# Patient Record
Sex: Female | Born: 1970 | Race: White | Hispanic: No | Smoking: Current some day smoker
Health system: Southern US, Community
[De-identification: ages and names within clinical notes are randomized; demographics above are authoritative.]

## PROBLEM LIST (undated history)

## (undated) DIAGNOSIS — J45909 Unspecified asthma, uncomplicated: Secondary | ICD-10-CM

## (undated) DIAGNOSIS — F32A Depression, unspecified: Secondary | ICD-10-CM

## (undated) DIAGNOSIS — G43909 Migraine, unspecified, not intractable, without status migrainosus: Secondary | ICD-10-CM

## (undated) DIAGNOSIS — F329 Major depressive disorder, single episode, unspecified: Secondary | ICD-10-CM

## (undated) DIAGNOSIS — J302 Other seasonal allergic rhinitis: Secondary | ICD-10-CM

## (undated) HISTORY — PX: NASAL SINUS SURGERY: SHX719

## (undated) HISTORY — PX: ABDOMINAL HYSTERECTOMY: SHX81

## (undated) HISTORY — PX: CHOLECYSTECTOMY: SHX55

---

## 2010-09-11 ENCOUNTER — Other Ambulatory Visit (HOSPITAL_COMMUNITY): Payer: Self-pay | Admitting: Internal Medicine

## 2010-09-11 DIAGNOSIS — Z1231 Encounter for screening mammogram for malignant neoplasm of breast: Secondary | ICD-10-CM

## 2010-09-21 ENCOUNTER — Ambulatory Visit (HOSPITAL_COMMUNITY)
Admission: RE | Admit: 2010-09-21 | Discharge: 2010-09-21 | Disposition: A | Discharge status: Home / Self Care | Payer: Self-pay | Source: Ambulatory Visit | Attending: Internal Medicine | Admitting: Internal Medicine

## 2010-09-21 DIAGNOSIS — Z1231 Encounter for screening mammogram for malignant neoplasm of breast: Secondary | ICD-10-CM

## 2011-03-10 ENCOUNTER — Other Ambulatory Visit (HOSPITAL_COMMUNITY): Payer: Self-pay | Admitting: Internal Medicine

## 2011-03-10 ENCOUNTER — Ambulatory Visit (HOSPITAL_COMMUNITY)
Admission: RE | Admit: 2011-03-10 | Discharge: 2011-03-10 | Disposition: A | Discharge status: Home / Self Care | Payer: Self-pay | Source: Ambulatory Visit | Attending: Internal Medicine | Admitting: Internal Medicine

## 2011-03-10 DIAGNOSIS — R52 Pain, unspecified: Secondary | ICD-10-CM

## 2011-03-10 DIAGNOSIS — Z01818 Encounter for other preprocedural examination: Secondary | ICD-10-CM | POA: Insufficient documentation

## 2012-09-04 ENCOUNTER — Encounter: Payer: Self-pay | Admitting: Obstetrics & Gynecology

## 2012-09-09 ENCOUNTER — Emergency Department (HOSPITAL_BASED_OUTPATIENT_CLINIC_OR_DEPARTMENT_OTHER)
Admission: EM | Admit: 2012-09-09 | Discharge: 2012-09-09 | Disposition: A | Discharge status: Home / Self Care | Payer: Self-pay | Attending: Emergency Medicine | Admitting: Emergency Medicine

## 2012-09-09 ENCOUNTER — Encounter (HOSPITAL_BASED_OUTPATIENT_CLINIC_OR_DEPARTMENT_OTHER): Payer: Self-pay | Admitting: *Deleted

## 2012-09-09 ENCOUNTER — Emergency Department (HOSPITAL_BASED_OUTPATIENT_CLINIC_OR_DEPARTMENT_OTHER): Payer: Self-pay

## 2012-09-09 DIAGNOSIS — J45901 Unspecified asthma with (acute) exacerbation: Secondary | ICD-10-CM | POA: Insufficient documentation

## 2012-09-09 DIAGNOSIS — R05 Cough: Secondary | ICD-10-CM | POA: Insufficient documentation

## 2012-09-09 DIAGNOSIS — R059 Cough, unspecified: Secondary | ICD-10-CM | POA: Insufficient documentation

## 2012-09-09 DIAGNOSIS — Z8709 Personal history of other diseases of the respiratory system: Secondary | ICD-10-CM | POA: Insufficient documentation

## 2012-09-09 HISTORY — DX: Unspecified asthma, uncomplicated: J45.909

## 2012-09-09 HISTORY — DX: Other seasonal allergic rhinitis: J30.2

## 2012-09-09 HISTORY — DX: Major depressive disorder, single episode, unspecified: F32.9

## 2012-09-09 HISTORY — DX: Depression, unspecified: F32.A

## 2012-09-09 MED ORDER — ALBUTEROL SULFATE (5 MG/ML) 0.5% IN NEBU: 5.0000 mg | INHALATION_SOLUTION | RESPIRATORY_TRACT | Status: AC | PRN

## 2012-09-09 MED ORDER — PREDNISONE 10 MG PO TABS
60.0000 mg | ORAL_TABLET | Freq: Once | ORAL | Status: AC
  Administered 2012-09-09: 60 mg via ORAL
  Filled 2012-09-09: qty 1

## 2012-09-09 MED ORDER — ALBUTEROL SULFATE (5 MG/ML) 0.5% IN NEBU
5.0000 mg | INHALATION_SOLUTION | Freq: Once | RESPIRATORY_TRACT | Status: AC
  Administered 2012-09-09: 5 mg via RESPIRATORY_TRACT
  Filled 2012-09-09: qty 1

## 2012-09-09 MED ORDER — ALBUTEROL SULFATE (5 MG/ML) 0.5% IN NEBU
5.0000 mg | INHALATION_SOLUTION | Freq: Once | RESPIRATORY_TRACT | Status: AC
  Administered 2012-09-09: 5 mg via RESPIRATORY_TRACT

## 2012-09-09 MED ORDER — ALBUTEROL SULFATE (5 MG/ML) 0.5% IN NEBU
INHALATION_SOLUTION | RESPIRATORY_TRACT | Status: AC
  Filled 2012-09-09: qty 1

## 2012-09-09 MED ORDER — IPRATROPIUM BROMIDE 0.02 % IN SOLN
RESPIRATORY_TRACT | Status: AC
  Filled 2012-09-09: qty 2.5

## 2012-09-09 MED ORDER — IPRATROPIUM BROMIDE 0.02 % IN SOLN
0.5000 mg | Freq: Once | RESPIRATORY_TRACT | Status: AC
  Administered 2012-09-09: 0.5 mg via RESPIRATORY_TRACT

## 2012-09-09 MED ORDER — PREDNISONE 20 MG PO TABS: 60.0000 mg | ORAL_TABLET | Freq: Every day | ORAL | Status: AC

## 2012-09-09 NOTE — ED Notes (Signed)
Patient has been experiencing sob for the past week, which grew worse yesterday, [facility], used inhaler this morning, but still experiencing sob, took antibiotic earlier this week, but did not complete

## 2012-09-09 NOTE — ED Provider Notes (Signed)
History     CSN: [id number]  Arrival date & time 09/09/12  4098   First MD Initiated Contact with Patient 09/09/12 1002      Chief Complaint  Patient presents with  . Shortness of Breath    (Consider location/radiation/quality/duration/timing/severity/associated sxs/prior treatment) Patient is a 42 y.o. female presenting with shortness of breath.  Shortness of Breath  Pt with history of asthma/bronchitis reports she had had cough and difficulty breathing for the last several weeks. She was seen at PCP office and give medications for allergies without improvement. She went to a [facility] earlier this week and given albuterol HFA but continued to have cough, wheezing and SOB. She reports [facility] would not give her a refill on her nebulizer medications or any steroids.   No past medical history on file.  No past surgical history on file.  No family history on file.  History  Substance Use Topics  . Smoking status: Not on file  . Smokeless tobacco: Not on file  . Alcohol Use: Not on file    OB History   No data available      Review of Systems  Respiratory: Positive for shortness of breath.    All other systems reviewed and are negative except as noted in HPI.   Allergies  Review of patient's allergies indicates not on file.  Home Medications  No current outpatient prescriptions on file.  BP 150/91  Pulse 107  Temp(Src) 98.1 F (36.7 C) (Oral)  Resp 26  SpO2 95%  Physical Exam  Nursing note and vitals reviewed. Constitutional: She is oriented to person, place, and time. She appears well-developed and well-nourished.  HENT:  Head: Normocephalic and atraumatic.  Eyes: EOM are normal. Pupils are equal, round, and reactive to light.  Neck: Normal range of motion. Neck supple.  Cardiovascular: Normal rate, normal heart sounds and intact distal pulses.   Pulmonary/Chest: She has wheezes. She has no rales.  Increased work of breathing  Abdominal: Bowel  sounds are normal. She exhibits no distension. There is no tenderness.  Musculoskeletal: Normal range of motion. She exhibits no edema and no tenderness.  Neurological: She is alert and oriented to person, place, and time. She has normal strength. No cranial nerve deficit or sensory deficit.  Skin: Skin is warm and dry. No rash noted.  Psychiatric: She has a normal mood and affect.    ED Course  Procedures (including critical care time)  Labs Reviewed - No data to display Dg Chest 2 View  09/09/2012  *RADIOLOGY REPORT*  Clinical Data: Cough, wheezing  CHEST - 2 VIEW  Comparison: 03/10/11  Findings: Cardiomediastinal silhouette is unremarkable.  No acute infiltrate or pleural effusion.  No pulmonary edema.  Bony thorax is unremarkable.  IMPRESSION: No active disease.   Original Report Authenticated By: Natasha Mead, M.D.      1. Asthma exacerbation       MDM  CXR, nebs, prednisone and reassess.   11:17 AM Wheezing improved after second neb. CXR clear. Plan for discharge with short burst of steroids, refill for nebs and PCP followup.       Dae Antonucci B. Bernette Mayers, MD 09/09/12 1118

## 2015-01-07 ENCOUNTER — Encounter (HOSPITAL_COMMUNITY): Payer: Self-pay | Admitting: *Deleted

## 2015-01-07 ENCOUNTER — Emergency Department (HOSPITAL_COMMUNITY)
Admission: EM | Admit: 2015-01-07 | Discharge: 2015-01-07 | Disposition: A | Discharge status: Home / Self Care | Payer: Managed Care, Other (non HMO) | Attending: Emergency Medicine | Admitting: Emergency Medicine

## 2015-01-07 DIAGNOSIS — J029 Acute pharyngitis, unspecified: Secondary | ICD-10-CM | POA: Insufficient documentation

## 2015-01-07 DIAGNOSIS — Z79899 Other long term (current) drug therapy: Secondary | ICD-10-CM | POA: Diagnosis not present

## 2015-01-07 DIAGNOSIS — Z7952 Long term (current) use of systemic steroids: Secondary | ICD-10-CM | POA: Diagnosis not present

## 2015-01-07 DIAGNOSIS — R0981 Nasal congestion: Secondary | ICD-10-CM | POA: Diagnosis not present

## 2015-01-07 DIAGNOSIS — R509 Fever, unspecified: Secondary | ICD-10-CM | POA: Insufficient documentation

## 2015-01-07 DIAGNOSIS — F329 Major depressive disorder, single episode, unspecified: Secondary | ICD-10-CM | POA: Diagnosis not present

## 2015-01-07 DIAGNOSIS — J45909 Unspecified asthma, uncomplicated: Secondary | ICD-10-CM | POA: Diagnosis not present

## 2015-01-07 DIAGNOSIS — Z720 Tobacco use: Secondary | ICD-10-CM | POA: Diagnosis not present

## 2015-01-07 HISTORY — DX: Migraine, unspecified, not intractable, without status migrainosus: G43.909

## 2015-01-07 LAB — RAPID STREP SCREEN (MED CTR MEBANE ONLY): STREPTOCOCCUS, GROUP A SCREEN (DIRECT): NEGATIVE

## 2015-01-07 NOTE — ED Notes (Signed)
Pt reports a 2 week hx of sore throat ,HA and low grade fever

## 2015-01-07 NOTE — ED Provider Notes (Signed)
CSN: [id number]     Arrival date & time 01/07/15  0819 History   First MD Initiated Contact with Patient 01/07/15 [phone]     Chief Complaint  Patient presents with  . Sore Throat  . Fever     (Consider location/radiation/quality/duration/timing/severity/associated sxs/prior Treatment) Patient is a 44 y.o. female presenting with pharyngitis and fever. The history is provided by the patient. No language interpreter was used.  Sore Throat This is a new problem. The current episode started in the past 7 days. The problem occurs constantly. The problem has been unchanged. Associated symptoms include congestion, coughing and a fever. Pertinent negatives include no rash or vomiting. The symptoms are aggravated by swallowing. She has tried nothing for the symptoms.  Fever Associated symptoms: congestion and cough   Associated symptoms: no rash and no vomiting     Past Medical History  Diagnosis Date  . Asthma   . Seasonal allergies   . Depression    Past Surgical History  Procedure Laterality Date  . Abdominal hysterectomy    . Cesarean section      X 2   No family history on file. Social History  Substance Use Topics  . Smoking status: Current Some Day Smoker  . Smokeless tobacco: Not on file  . Alcohol Use: Yes   OB History    No data available     Review of Systems  Constitutional: Positive for fever.  HENT: Positive for congestion.   Respiratory: Positive for cough.   Gastrointestinal: Negative for vomiting.  Skin: Negative for rash.  All other systems reviewed and are negative.     Allergies  Review of patient's allergies indicates no known allergies.  Home Medications   Prior to Admission medications   Medication Sig Start Date End Date Taking? Authorizing Provider  albuterol (PROVENTIL) (5 MG/ML) 0.5% nebulizer solution Take 1 mL (5 mg total) by nebulization every 4 (four) hours as needed for wheezing. 09/09/12   Susy Frizzle, MD  ALBUTEROL IN Inhale into  the lungs as needed.    Historical Provider, MD  ClonazePAM (KLONOPIN PO) Take by mouth as needed.    Historical Provider, MD  Montelukast Sodium (SINGULAIR PO) Take by mouth.    Historical Provider, MD  predniSONE (DELTASONE) 20 MG tablet Take 3 tablets (60 mg total) by mouth daily. 09/09/12   Susy Frizzle, MD   There were no vitals taken for this visit. Physical Exam  Constitutional: She is oriented to person, place, and time. She appears well-developed and well-nourished.  HENT:  Right Ear: External ear normal.  Left Ear: External ear normal.  Mouth/Throat: Posterior oropharyngeal erythema present.  Cardiovascular: Normal rate and regular rhythm.   Pulmonary/Chest: Effort normal and breath sounds normal.  Musculoskeletal: Normal range of motion.  Neurological: She is alert and oriented to person, place, and time. Coordination normal.  Skin: Skin is warm and dry.  Nursing note and vitals reviewed.   ED Course  Procedures (including critical care time) Labs Review Labs Reviewed  RAPID STREP SCREEN (NOT AT [facility])    Imaging Review No results found. I have personally reviewed and evaluated these images and lab results as part of my medical decision-making.   EKG Interpretation None      MDM   Final diagnoses:  Pharyngitis    Strep is negative. Likely viral in nature    Teressa Lower, NP 01/07/15 1191  Lyndal Pulley, MD 01/07/15 [phone]

## 2015-01-07 NOTE — ED Notes (Signed)
Declined W/C at D/C and was escorted to lobby by RN. 

## 2015-01-07 NOTE — Discharge Instructions (Signed)

## 2015-01-09 LAB — CULTURE, GROUP A STREP: STREP A CULTURE: NEGATIVE

## 2018-10-29 ENCOUNTER — Other Ambulatory Visit: Payer: Self-pay

## 2018-10-29 ENCOUNTER — Emergency Department (HOSPITAL_COMMUNITY): Payer: Self-pay

## 2018-10-29 ENCOUNTER — Encounter (HOSPITAL_COMMUNITY): Payer: Self-pay | Admitting: Emergency Medicine

## 2018-10-29 ENCOUNTER — Emergency Department (HOSPITAL_COMMUNITY)
Admission: EM | Admit: 2018-10-29 | Discharge: 2018-10-29 | Disposition: A | Discharge status: Home / Self Care | Payer: Self-pay | Attending: Emergency Medicine | Admitting: Emergency Medicine

## 2018-10-29 DIAGNOSIS — R1084 Generalized abdominal pain: Secondary | ICD-10-CM | POA: Insufficient documentation

## 2018-10-29 DIAGNOSIS — Z79899 Other long term (current) drug therapy: Secondary | ICD-10-CM | POA: Insufficient documentation

## 2018-10-29 DIAGNOSIS — J45909 Unspecified asthma, uncomplicated: Secondary | ICD-10-CM | POA: Insufficient documentation

## 2018-10-29 DIAGNOSIS — E876 Hypokalemia: Secondary | ICD-10-CM | POA: Insufficient documentation

## 2018-10-29 DIAGNOSIS — F17200 Nicotine dependence, unspecified, uncomplicated: Secondary | ICD-10-CM | POA: Insufficient documentation

## 2018-10-29 LAB — CBC WITH DIFFERENTIAL/PLATELET
Abs Immature Granulocytes: 0.04 10*3/uL (ref 0.00–0.07)
Basophils Absolute: 0.1 10*3/uL (ref 0.0–0.1)
Basophils Relative: 1 %
Eosinophils Absolute: 0 10*3/uL (ref 0.0–0.5)
Eosinophils Relative: 0 %
HCT: 43.2 % (ref 36.0–46.0)
Hemoglobin: 15.1 g/dL — ABNORMAL HIGH (ref 12.0–15.0)
Immature Granulocytes: 0 %
Lymphocytes Relative: 11 %
Lymphs Abs: 1.4 10*3/uL (ref 0.7–4.0)
MCH: 29.4 pg (ref 26.0–34.0)
MCHC: 35 g/dL (ref 30.0–36.0)
MCV: 84.2 fL (ref 80.0–100.0)
Monocytes Absolute: 0.4 10*3/uL (ref 0.1–1.0)
Monocytes Relative: 3 %
Neutro Abs: 11.3 10*3/uL — ABNORMAL HIGH (ref 1.7–7.7)
Neutrophils Relative %: 85 %
Platelets: 305 10*3/uL (ref 150–400)
RBC: 5.13 MIL/uL — ABNORMAL HIGH (ref 3.87–5.11)
RDW: 12.6 % (ref 11.5–15.5)
WBC: 13.3 10*3/uL — ABNORMAL HIGH (ref 4.0–10.5)
nRBC: 0 % (ref 0.0–0.2)

## 2018-10-29 LAB — URINALYSIS, ROUTINE W REFLEX MICROSCOPIC
Bilirubin Urine: NEGATIVE
Glucose, UA: NEGATIVE mg/dL
Ketones, ur: NEGATIVE mg/dL
Leukocytes,Ua: NEGATIVE
Nitrite: NEGATIVE
Protein, ur: NEGATIVE mg/dL
Specific Gravity, Urine: 1.015 (ref 1.005–1.030)
pH: 7 (ref 5.0–8.0)

## 2018-10-29 LAB — RAPID URINE DRUG SCREEN, HOSP PERFORMED
Amphetamines: NOT DETECTED
Barbiturates: NOT DETECTED
Benzodiazepines: NOT DETECTED
Cocaine: NOT DETECTED
Opiates: NOT DETECTED
Tetrahydrocannabinol: POSITIVE — AB

## 2018-10-29 LAB — COMPREHENSIVE METABOLIC PANEL
ALT: 17 U/L (ref 0–44)
AST: 17 U/L (ref 15–41)
Albumin: 4.3 g/dL (ref 3.5–5.0)
Alkaline Phosphatase: 55 U/L (ref 38–126)
Anion gap: 11 (ref 5–15)
BUN: <5 mg/dL — ABNORMAL LOW (ref 6–20)
CO2: 22 mmol/L (ref 22–32)
Calcium: 9.3 mg/dL (ref 8.9–10.3)
Chloride: 105 mmol/L (ref 98–111)
Creatinine, Ser: 0.83 mg/dL (ref 0.44–1.00)
GFR calc Af Amer: >60 mL/min (ref >60)
GFR calc non Af Amer: >60 mL/min (ref >60)
Glucose, Bld: 135 mg/dL — ABNORMAL HIGH (ref 70–99)
Potassium: 2.8 mmol/L — ABNORMAL LOW (ref 3.5–5.1)
Sodium: 138 mmol/L (ref 135–145)
Total Bilirubin: 0.6 mg/dL (ref 0.3–1.2)
Total Protein: 7 g/dL (ref 6.5–8.1)

## 2018-10-29 LAB — LIPASE, BLOOD: Lipase: 28 U/L (ref 11–51)

## 2018-10-29 LAB — POC OCCULT BLOOD, ED: Fecal Occult Bld: NEGATIVE

## 2018-10-29 MED ORDER — PROMETHAZINE HCL 25 MG PO TABS: 25.0000 mg | ORAL_TABLET | Freq: Four times a day (QID) | ORAL | 0 refills | Status: AC | PRN

## 2018-10-29 MED ORDER — HYDROMORPHONE HCL 1 MG/ML IJ SOLN
0.5000 mg | Freq: Once | INTRAMUSCULAR | Status: AC
  Administered 2018-10-29: 0.5 mg via INTRAVENOUS
  Filled 2018-10-29: qty 1

## 2018-10-29 MED ORDER — CAPSAICIN 0.025 % EX CREA
TOPICAL_CREAM | CUTANEOUS | Status: DC
Start: 2018-10-27 — End: 2018-10-29

## 2018-10-29 MED ORDER — POTASSIUM CHLORIDE 10 MEQ/100ML IV SOLN
10.0000 meq | Freq: Once | INTRAVENOUS | Status: AC
  Administered 2018-10-29: 10 meq via INTRAVENOUS
  Filled 2018-10-29: qty 100

## 2018-10-29 MED ORDER — SODIUM CHLORIDE 0.9 % IV BOLUS
1000.0000 mL | Freq: Once | INTRAVENOUS | Status: AC
  Administered 2018-10-29: 1000 mL via INTRAVENOUS

## 2018-10-29 MED ORDER — IOHEXOL 300 MG/ML  SOLN
100.0000 mL | Freq: Once | INTRAMUSCULAR | Status: AC | PRN
  Administered 2018-10-29: 100 mL via INTRAVENOUS

## 2018-10-29 MED ORDER — ONDANSETRON HCL 4 MG/2ML IJ SOLN
4.0000 mg | Freq: Once | INTRAMUSCULAR | Status: AC
  Administered 2018-10-29: 08:00:00 4 mg via INTRAVENOUS
  Filled 2018-10-29: qty 2

## 2018-10-29 MED ORDER — DICYCLOMINE HCL 20 MG PO TABS: 20.0000 mg | ORAL_TABLET | Freq: Two times a day (BID) | ORAL | 0 refills | Status: AC

## 2018-10-29 NOTE — Discharge Instructions (Addendum)
Please return for any problem.  Follow-up with your regular care provider as instructed. °

## 2018-10-29 NOTE — ED Notes (Signed)
Pt ambulated to bathroom unassisted. Pt reported it made her very tired. Pt is asking for pain medication.

## 2018-10-29 NOTE — ED Provider Notes (Signed)
[facility]   CSN: [id number] Arrival date & time: 10/29/18  0746     History   Chief Complaint Chief Complaint  Patient presents with   Abdominal Pain    HPI Joann RosenthalKaren Benscoter is a 48 y.o. female.     48 year old female with prior medical history as detailed below presents for evaluation of abdominal pain.  Patient reports persistent intermittent episodes of abdominal discomfort.  She has been previously diagnosed with abdominal migraines.  She reports increased pain over the last 3 to 4 days.  This is associated with nausea, vomiting, and constipation.  She denies fever.  She denies bloody stool.  Pain today is consistent with her prior episodes.  The history is provided by the patient and medical records.  Abdominal Pain Pain location:  Generalized Pain quality: aching and bloating   Pain radiates to:  Does not radiate Pain severity:  Moderate Onset quality:  Gradual Duration:  4 days Timing:  Constant Progression:  Waxing and waning Chronicity:  New Relieved by:  Nothing Worsened by:  Nothing Ineffective treatments:  None tried   Past Medical History:  Diagnosis Date   Asthma    Depression    Migraine    Seasonal allergies     There are no active problems to display for this patient.   Past Surgical History:  Procedure Laterality Date   ABDOMINAL HYSTERECTOMY     CESAREAN SECTION     X 2   CHOLECYSTECTOMY     NASAL SINUS SURGERY       OB History   No obstetric history on file.      Home Medications    Prior to Admission medications   Medication Sig Start Date End Date Taking? Authorizing Provider  albuterol (PROVENTIL) (5 MG/ML) 0.5% nebulizer solution Take 1 mL (5 mg total) by nebulization every 4 (four) hours as needed for wheezing. 09/09/12   Susy FrizzleSheldon, Charles, MD  ALBUTEROL IN Inhale into the lungs as needed.    [provider]  ClonazePAM (KLONOPIN PO) Take by mouth as  needed.    [provider]  dicyclomine (BENTYL) 20 MG tablet Take 1 tablet (20 mg total) by mouth 2 (two) times daily. 10/29/18   Wynetta FinesMessick, Kaymarie Wynn C, MD  Montelukast Sodium (SINGULAIR PO) Take by mouth.    [provider]  predniSONE (DELTASONE) 20 MG tablet Take 3 tablets (60 mg total) by mouth daily. 09/09/12   Susy FrizzleSheldon, Charles, MD  promethazine (PHENERGAN) 25 MG tablet Take 1 tablet (25 mg total) by mouth every 6 (six) hours as needed for nausea or vomiting. 10/29/18   Wynetta FinesMessick, Leonid Manus C, MD    Family History No family history on file.  Social History Social History   Tobacco Use   Smoking status: Current Some Day Smoker  Substance Use Topics   Alcohol use: Yes   Drug use: No     Allergies   Xanax [alprazolam]   Review of Systems Review of Systems  Gastrointestinal: Positive for abdominal pain.  All other systems reviewed and are negative.    Physical Exam Updated Vital Signs BP 129/77 (BP Location: Right Arm)    Pulse (!) 55    Temp 98.8 F (37.1 C) (Oral)    Resp 15    Ht 5\' 3"  (1.6 m)    Wt 60.8 kg    SpO2 100%    BMI 23.74 kg/m   Physical Exam Vitals signs and nursing note reviewed.  Constitutional:      General: She is not in acute distress.    Appearance: She is well-developed.  HENT:     Head: Normocephalic and atraumatic.  Eyes:     Conjunctiva/sclera: Conjunctivae normal.     Pupils: Pupils are equal, round, and reactive to light.  Neck:     Musculoskeletal: Normal range of motion and neck supple.  Cardiovascular:     Rate and Rhythm: Normal rate and regular rhythm.     Heart sounds: Normal heart sounds.  Pulmonary:     Effort: Pulmonary effort is normal. No respiratory distress.     Breath sounds: Normal breath sounds.  Abdominal:     General: There is no distension.     Palpations: Abdomen is soft.     Tenderness: There is generalized abdominal tenderness.  Musculoskeletal: Normal range of motion.        General: No deformity.    Skin:    General: Skin is warm and dry.  Neurological:     Mental Status: She is alert and oriented to person, place, and time.      ED Treatments / Results  Labs (all labs ordered are listed, but only abnormal results are displayed) Labs Reviewed  URINALYSIS, ROUTINE W REFLEX MICROSCOPIC - Abnormal; Notable for the following components:      Result Value   Hgb urine dipstick SMALL (*)    Bacteria, UA RARE (*)    All other components within normal limits  RAPID URINE DRUG SCREEN, HOSP PERFORMED - Abnormal; Notable for the following components:   Tetrahydrocannabinol POSITIVE (*)    All other components within normal limits  COMPREHENSIVE METABOLIC PANEL - Abnormal; Notable for the following components:   Potassium 2.8 (*)    Glucose, Bld 135 (*)    BUN <5 (*)    All other components within normal limits  CBC WITH DIFFERENTIAL/PLATELET - Abnormal; Notable for the following components:   WBC 13.3 (*)    RBC 5.13 (*)    Hemoglobin 15.1 (*)    Neutro Abs 11.3 (*)    All other components within normal limits  LIPASE, BLOOD  POC OCCULT BLOOD, ED    EKG    Radiology Ct Abdomen Pelvis W Contrast  Result Date: 10/29/2018 CLINICAL DATA:  Abdominal pain, nausea and vomiting for several days EXAM: CT ABDOMEN AND PELVIS WITH CONTRAST TECHNIQUE: Multidetector CT imaging of the abdomen and pelvis was performed using the standard protocol following bolus administration of intravenous contrast. CONTRAST:  134mL OMNIPAQUE IOHEXOL 300 MG/ML  SOLN COMPARISON:  09/14/2018 CT abdomen/pelvis. FINDINGS: Lower chest: No significant pulmonary nodules or acute consolidative airspace disease. Hepatobiliary: Normal liver size. No liver mass. Cholecystectomy. No biliary ductal dilatation. Pancreas: Normal, with no mass or duct dilation. Spleen: Normal size. No mass. Adrenals/Urinary Tract: Normal adrenals. A few scattered subcentimeter hypodense renal cortical lesions in both kidneys are too small to  characterize and are unchanged, requiring no follow-up. Fullness of the central renal collecting systems bilaterally, without overt hydronephrosis. Contrast nephrograms are symmetric and normal. Normal bladder. Stomach/Bowel: Normal non-distended stomach. Normal caliber small bowel with no small bowel wall thickening. Normal appendix. Normal large bowel with no diverticulosis, large bowel wall thickening or pericolonic fat stranding. Vascular/Lymphatic: Atherosclerotic nonaneurysmal abdominal aorta. Patent portal, splenic, hepatic and renal veins. No pathologically enlarged lymph nodes in the abdomen or pelvis. Reproductive: Status post hysterectomy, with no abnormal findings at the vaginal cuff. No adnexal mass. Other: No pneumoperitoneum, ascites or focal fluid  collection. Musculoskeletal: No aggressive appearing focal osseous lesions. Mild thoracolumbar spondylosis. IMPRESSION: 1. No acute abnormality. No evidence of bowel obstruction or acute bowel inflammation. Normal appendix. 2.  Aortic Atherosclerosis (ICD10-I70.0). Electronically Signed   By: Delbert PhenixJason A Poff M.D.   On: 10/29/2018 10:37    Procedures Procedures (including critical care time)  Medications Ordered in ED Medications  sodium chloride 0.9 % bolus 1,000 mL (0 mLs Intravenous Stopped 10/29/18 1203)  ondansetron (ZOFRAN) injection 4 mg (4 mg Intravenous Given 10/29/18 0820)  HYDROmorphone (DILAUDID) injection 0.5 mg (0.5 mg Intravenous Given 10/29/18 0828)  iohexol (OMNIPAQUE) 300 MG/ML solution 100 mL (100 mLs Intravenous Contrast Given 10/29/18 0948)  potassium chloride 10 mEq in 100 mL IVPB (0 mEq Intravenous Stopped 10/29/18 1202)  HYDROmorphone (DILAUDID) injection 0.5 mg (0.5 mg Intravenous Given 10/29/18 1206)     Initial Impression / Assessment and Plan / ED Course  I have reviewed the triage vital signs and the nursing notes.  Pertinent labs & imaging results that were available during my care of the patient were reviewed by me  and considered in my medical decision making (see chart for details).        MDM  Screen complete  Joann RosenthalKaren Logie was evaluated in Emergency Department on 10/29/2018 for the symptoms described in the history of present illness. She was evaluated in the context of the global COVID-19 pandemic, which necessitated consideration that the patient might be at risk for infection with the SARS-CoV-2 virus that causes COVID-19. Institutional protocols and algorithms that pertain to the evaluation of patients at risk for COVID-19 are in a state of rapid change based on information released by regulatory bodies including the CDC and federal and state organizations. These policies and algorithms were followed during the patient's care in the ED.   Patient is presenting for evaluation acute on chronic abdominal pain.  Her reported symptoms and exam are consistent with prior episodes.   Work-up obtained in the ED is without significant abnormality.  Her potassium was noted to be low.  This was repleted.  Patient felt significantly better following her ED evaluation.  She appears to be appropriate for discharge home.  CT imaging did not reveal any acute pathology.  Patient does understand need for close follow-up.  Strict return precautions given and understood.    Final Clinical Impressions(s) / ED Diagnoses   Final diagnoses:  Generalized abdominal pain  Hypokalemia    ED Discharge Orders         Ordered    dicyclomine (BENTYL) 20 MG tablet  2 times daily     10/29/18 1207    promethazine (PHENERGAN) 25 MG tablet  Every 6 hours PRN     10/29/18 1207           Wynetta FinesMessick, Phaedra Colgate C, MD 10/29/18 1249

## 2018-10-29 NOTE — ED Triage Notes (Signed)
Pt states she has had N/V X5  Days. Pt states she is able to have bowel movements but they are small and appear to have blood in them. Last [facility] 10/29/18

## 2018-10-29 NOTE — ED Notes (Signed)
Pt verbalized discharge instructions and follow up. IV removed and bleeding controlled. ID band removed. Pt ambulatory to lobby with steady gait.

## 2018-10-31 ENCOUNTER — Other Ambulatory Visit: Payer: Self-pay

## 2018-10-31 ENCOUNTER — Observation Stay (HOSPITAL_BASED_OUTPATIENT_CLINIC_OR_DEPARTMENT_OTHER)
Admission: EM | Admit: 2018-10-31 | Discharge: 2018-11-01 | Disposition: A | Discharge status: Home / Self Care | Payer: Medicaid Other | Attending: Internal Medicine | Admitting: Internal Medicine

## 2018-10-31 ENCOUNTER — Encounter (HOSPITAL_BASED_OUTPATIENT_CLINIC_OR_DEPARTMENT_OTHER): Payer: Self-pay | Admitting: Emergency Medicine

## 2018-10-31 DIAGNOSIS — R112 Nausea with vomiting, unspecified: Secondary | ICD-10-CM | POA: Diagnosis present

## 2018-10-31 DIAGNOSIS — Z7952 Long term (current) use of systemic steroids: Secondary | ICD-10-CM | POA: Insufficient documentation

## 2018-10-31 DIAGNOSIS — J45909 Unspecified asthma, uncomplicated: Secondary | ICD-10-CM

## 2018-10-31 DIAGNOSIS — F1210 Cannabis abuse, uncomplicated: Secondary | ICD-10-CM | POA: Insufficient documentation

## 2018-10-31 DIAGNOSIS — I700 Atherosclerosis of aorta: Secondary | ICD-10-CM | POA: Insufficient documentation

## 2018-10-31 DIAGNOSIS — R1010 Upper abdominal pain, unspecified: Secondary | ICD-10-CM | POA: Insufficient documentation

## 2018-10-31 DIAGNOSIS — R1111 Vomiting without nausea: Secondary | ICD-10-CM | POA: Insufficient documentation

## 2018-10-31 DIAGNOSIS — Z79899 Other long term (current) drug therapy: Secondary | ICD-10-CM | POA: Insufficient documentation

## 2018-10-31 DIAGNOSIS — G43909 Migraine, unspecified, not intractable, without status migrainosus: Secondary | ICD-10-CM | POA: Insufficient documentation

## 2018-10-31 DIAGNOSIS — Z1159 Encounter for screening for other viral diseases: Secondary | ICD-10-CM | POA: Insufficient documentation

## 2018-10-31 DIAGNOSIS — E785 Hyperlipidemia, unspecified: Secondary | ICD-10-CM | POA: Insufficient documentation

## 2018-10-31 DIAGNOSIS — D72829 Elevated white blood cell count, unspecified: Secondary | ICD-10-CM | POA: Insufficient documentation

## 2018-10-31 DIAGNOSIS — Z9049 Acquired absence of other specified parts of digestive tract: Secondary | ICD-10-CM | POA: Insufficient documentation

## 2018-10-31 DIAGNOSIS — E876 Hypokalemia: Principal | ICD-10-CM | POA: Insufficient documentation

## 2018-10-31 DIAGNOSIS — I10 Essential (primary) hypertension: Secondary | ICD-10-CM | POA: Insufficient documentation

## 2018-10-31 DIAGNOSIS — F17200 Nicotine dependence, unspecified, uncomplicated: Secondary | ICD-10-CM | POA: Insufficient documentation

## 2018-10-31 DIAGNOSIS — R519 Headache, unspecified: Secondary | ICD-10-CM

## 2018-10-31 DIAGNOSIS — J4520 Mild intermittent asthma, uncomplicated: Secondary | ICD-10-CM | POA: Insufficient documentation

## 2018-10-31 DIAGNOSIS — F329 Major depressive disorder, single episode, unspecified: Secondary | ICD-10-CM | POA: Insufficient documentation

## 2018-10-31 LAB — COMPREHENSIVE METABOLIC PANEL
ALT: 17 U/L (ref 0–44)
AST: 18 U/L (ref 15–41)
Albumin: 4.9 g/dL (ref 3.5–5.0)
Alkaline Phosphatase: 55 U/L (ref 38–126)
Anion gap: 14 (ref 5–15)
BUN: 8 mg/dL (ref 6–20)
CO2: 22 mmol/L (ref 22–32)
Calcium: 9.4 mg/dL (ref 8.9–10.3)
Chloride: 102 mmol/L (ref 98–111)
Creatinine, Ser: 0.89 mg/dL (ref 0.44–1.00)
GFR calc Af Amer: >60 mL/min (ref >60)
GFR calc non Af Amer: >60 mL/min (ref >60)
Glucose, Bld: 126 mg/dL — ABNORMAL HIGH (ref 70–99)
Potassium: 2.8 mmol/L — ABNORMAL LOW (ref 3.5–5.1)
Sodium: 138 mmol/L (ref 135–145)
Total Bilirubin: 0.8 mg/dL (ref 0.3–1.2)
Total Protein: 8.1 g/dL (ref 6.5–8.1)

## 2018-10-31 LAB — CBC WITH DIFFERENTIAL/PLATELET
Abs Immature Granulocytes: 0.03 10*3/uL (ref 0.00–0.07)
Basophils Absolute: 0.1 10*3/uL (ref 0.0–0.1)
Basophils Relative: 1 %
Eosinophils Absolute: 0.1 10*3/uL (ref 0.0–0.5)
Eosinophils Relative: 0 %
HCT: 45.7 % (ref 36.0–46.0)
Hemoglobin: 15.8 g/dL — ABNORMAL HIGH (ref 12.0–15.0)
Immature Granulocytes: 0 %
Lymphocytes Relative: 11 %
Lymphs Abs: 1.8 10*3/uL (ref 0.7–4.0)
MCH: 29.5 pg (ref 26.0–34.0)
MCHC: 34.6 g/dL (ref 30.0–36.0)
MCV: 85.3 fL (ref 80.0–100.0)
Monocytes Absolute: 0.7 10*3/uL (ref 0.1–1.0)
Monocytes Relative: 4 %
Neutro Abs: 13.4 10*3/uL — ABNORMAL HIGH (ref 1.7–7.7)
Neutrophils Relative %: 84 %
Platelets: 329 10*3/uL (ref 150–400)
RBC: 5.36 MIL/uL — ABNORMAL HIGH (ref 3.87–5.11)
RDW: 13 % (ref 11.5–15.5)
WBC: 16 10*3/uL — ABNORMAL HIGH (ref 4.0–10.5)
nRBC: 0 % (ref 0.0–0.2)

## 2018-10-31 LAB — MAGNESIUM: Magnesium: 2 mg/dL (ref 1.7–2.4)

## 2018-10-31 LAB — SARS CORONAVIRUS 2 BY RT PCR (HOSPITAL ORDER, PERFORMED IN ~~LOC~~ HOSPITAL LAB): SARS Coronavirus 2: NEGATIVE

## 2018-10-31 LAB — LIPASE, BLOOD: Lipase: 29 U/L (ref 11–51)

## 2018-10-31 LAB — POTASSIUM: Potassium: 3.4 mmol/L — ABNORMAL LOW (ref 3.5–5.1)

## 2018-10-31 LAB — SARS CORONAVIRUS 2 AG (30 MIN TAT): SARS Coronavirus 2 Ag: NEGATIVE

## 2018-10-31 MED ORDER — SODIUM CHLORIDE 0.9 % IV BOLUS
500.0000 mL | Freq: Once | INTRAVENOUS | Status: AC
  Administered 2018-10-31: 09:00:00 500 mL via INTRAVENOUS

## 2018-10-31 MED ORDER — CAPSAICIN 0.025 % EX CREA
TOPICAL_CREAM | Freq: Once | CUTANEOUS | Status: DC
  Filled 2018-10-31 (×2): qty 60

## 2018-10-31 MED ORDER — METOCLOPRAMIDE HCL 5 MG/ML IJ SOLN
10.0000 mg | Freq: Once | INTRAMUSCULAR | Status: AC
  Administered 2018-10-31: 10 mg via INTRAVENOUS
  Filled 2018-10-31: qty 2

## 2018-10-31 MED ORDER — PROCHLORPERAZINE EDISYLATE 10 MG/2ML IJ SOLN
10.0000 mg | Freq: Once | INTRAMUSCULAR | Status: AC
  Administered 2018-10-31: 13:00:00 10 mg via INTRAVENOUS
  Filled 2018-10-31: qty 2

## 2018-10-31 MED ORDER — POTASSIUM CHLORIDE 10 MEQ/100ML IV SOLN
10.0000 meq | Freq: Once | INTRAVENOUS | Status: AC
  Administered 2018-10-31: 10 meq via INTRAVENOUS
  Filled 2018-10-31: qty 100

## 2018-10-31 MED ORDER — ENOXAPARIN SODIUM 40 MG/0.4ML ~~LOC~~ SOLN: 40.0000 mg | SUBCUTANEOUS | Status: DC

## 2018-10-31 MED ORDER — POTASSIUM CHLORIDE CRYS ER 20 MEQ PO TBCR
40.0000 meq | EXTENDED_RELEASE_TABLET | Freq: Once | ORAL | Status: AC
  Administered 2018-10-31: 40 meq via ORAL
  Filled 2018-10-31: qty 2

## 2018-10-31 MED ORDER — SODIUM CHLORIDE 0.9 % IV BOLUS
1000.0000 mL | Freq: Once | INTRAVENOUS | Status: AC
  Administered 2018-10-31: 07:00:00 1000 mL via INTRAVENOUS

## 2018-10-31 MED ORDER — ATENOLOL 25 MG PO TABS
25.0000 mg | ORAL_TABLET | Freq: Every day | ORAL | Status: DC
  Administered 2018-10-31 – 2018-11-01 (×2): 25 mg via ORAL
  Filled 2018-10-31 (×2): qty 1

## 2018-10-31 MED ORDER — BUTALBITAL-APAP-CAFFEINE 50-325-40 MG PO TABS
1.0000 | ORAL_TABLET | ORAL | Status: DC | PRN
  Administered 2018-11-01: 1 via ORAL
  Filled 2018-10-31: qty 1

## 2018-10-31 MED ORDER — ONDANSETRON HCL 4 MG/2ML IJ SOLN
4.0000 mg | Freq: Once | INTRAMUSCULAR | Status: AC
  Administered 2018-10-31: 07:00:00 4 mg via INTRAVENOUS
  Filled 2018-10-31: qty 2

## 2018-10-31 MED ORDER — ATORVASTATIN CALCIUM 40 MG PO TABS
40.0000 mg | ORAL_TABLET | Freq: Every day | ORAL | Status: DC
  Administered 2018-10-31: 18:00:00 40 mg via ORAL
  Filled 2018-10-31: qty 1

## 2018-10-31 MED ORDER — GABAPENTIN 300 MG PO CAPS
300.0000 mg | ORAL_CAPSULE | Freq: Once | ORAL | Status: DC
  Filled 2018-10-31: qty 1

## 2018-10-31 MED ORDER — POTASSIUM CHLORIDE 10 MEQ/100ML IV SOLN
10.0000 meq | INTRAVENOUS | Status: AC
  Administered 2018-10-31 (×5): 10 meq via INTRAVENOUS
  Filled 2018-10-31 (×5): qty 100

## 2018-10-31 MED ORDER — OXYCODONE HCL 5 MG PO TABS
5.0000 mg | ORAL_TABLET | Freq: Once | ORAL | Status: AC
  Administered 2018-10-31: 22:00:00 5 mg via ORAL
  Filled 2018-10-31: qty 1

## 2018-10-31 MED ORDER — IPRATROPIUM-ALBUTEROL 0.5-2.5 (3) MG/3ML IN SOLN: 3.0000 mL | Freq: Four times a day (QID) | RESPIRATORY_TRACT | Status: DC | PRN

## 2018-10-31 MED ORDER — MONTELUKAST SODIUM 10 MG PO TABS
10.0000 mg | ORAL_TABLET | Freq: Every day | ORAL | Status: DC
  Filled 2018-10-31: qty 1

## 2018-10-31 MED ORDER — IPRATROPIUM-ALBUTEROL 0.5-2.5 (3) MG/3ML IN SOLN: 3.0000 mL | Freq: Four times a day (QID) | RESPIRATORY_TRACT | Status: DC

## 2018-10-31 MED ORDER — ONDANSETRON HCL 4 MG/2ML IJ SOLN
4.0000 mg | Freq: Four times a day (QID) | INTRAMUSCULAR | Status: DC | PRN
  Administered 2018-10-31 – 2018-11-01 (×3): 4 mg via INTRAVENOUS
  Filled 2018-10-31 (×3): qty 2

## 2018-10-31 MED ORDER — DIPHENHYDRAMINE HCL 50 MG/ML IJ SOLN
50.0000 mg | Freq: Once | INTRAMUSCULAR | Status: AC
  Administered 2018-10-31: 09:00:00 50 mg via INTRAVENOUS
  Filled 2018-10-31: qty 1

## 2018-10-31 MED ORDER — SODIUM CHLORIDE 0.9 % IV SOLN
INTRAVENOUS | Status: DC
  Administered 2018-10-31 – 2018-11-01 (×4): via INTRAVENOUS

## 2018-10-31 MED ORDER — ACETAMINOPHEN 325 MG PO TABS
650.0000 mg | ORAL_TABLET | Freq: Four times a day (QID) | ORAL | Status: DC | PRN
  Administered 2018-10-31 – 2018-11-01 (×2): 650 mg via ORAL
  Filled 2018-10-31 (×3): qty 2

## 2018-10-31 NOTE — Progress Notes (Signed)
Patient was transferred from [facility] med center at 1430. Alert and oriented x 4. Abdominal pain is complained. Paged MD as well. Room is set up and call lights was within patient's reach.

## 2018-10-31 NOTE — ED Notes (Signed)
Pt c/o nausea returned

## 2018-10-31 NOTE — ED Provider Notes (Signed)
Patient's care signed out this morning with recurrent upper abdominal pain and persistent vomiting over the past week.  Patient has been seen at [facility] and [facility] for similar.  Every time patient goes home she feels worse.  Patient had CT scan with no acute findings at [facility] and I reviewed results.  On reassessment patient still having significant discomfort not able to tolerate oral.  Patient does have hypokalemia 2.8.  IV and oral potassium ordered.  Repeat IV fluids and nausea/migraine medications ordered.  Patient has mild left upper abdominal tenderness on exam.  Consult to the hospitalist for observation/admission to [facility] for intractable vomiting, abdominal pain, hypokalemia for further management and treatment.  I had a long discussion with the hospitalist including her inability to tolerate oral, hypokalemia, third visit to the emergency room with worsening symptoms.  I did advocate for observation for further treatment/work-up at [facility].  The patients results and plan were reviewed and discussed.   Any x-rays performed were independently reviewed by myself.   Differential diagnosis were considered with the presenting HPI.  Medications  capsaicin (ZOSTRIX) 0.025 % cream ( Topical Not Given 10/31/18 0723)  sodium chloride 0.9 % bolus 500 mL (has no administration in time range)  metoCLOPramide (REGLAN) injection 10 mg (has no administration in time range)  diphenhydrAMINE (BENADRYL) injection 50 mg (has no administration in time range)  potassium chloride SA (K-DUR) CR tablet 40 mEq (has no administration in time range)  potassium chloride 10 mEq in 100 mL IVPB (has no administration in time range)  sodium chloride 0.9 % bolus 1,000 mL (1,000 mLs Intravenous New Bag/Given 10/31/18 0701)  ondansetron (ZOFRAN) injection 4 mg (4 mg Intravenous Given 10/31/18 0710)    Vitals:   10/31/18 0629 10/31/18 0634  BP:  (!) 174/93  Pulse:  (!) 55   Resp:  (!) 22  Temp:  98.3 F (36.8 C)  TempSrc:  Oral  SpO2:  100%  Weight: 60.8 kg   Height: 5\' 3"  (1.6 m)     Final diagnoses:  Hypokalemia  Intractable vomiting without nausea, unspecified vomiting type  Upper abdominal pain    Admission/ observation were discussed with the admitting physician, patient and/or family and they are comfortable with the plan.     Elnora Morrison, MD 10/31/18 [phone]

## 2018-10-31 NOTE — ED Notes (Signed)
Attempted to call report to [facility]; this RN contact info provided for call back.

## 2018-10-31 NOTE — H&P (Signed)
History and Physical    Joann Colon ZOX:[id number] DOB: 06/28/1970 DOA: 10/31/2018  PCP: Concepcion Elk, MD   Patient coming from: [facility]    Chief Complaint: Nausea, vomiting, abdominal pain  HPI: Joann Colon is a 48 y.o. female with medical history significant of hypertension, asthma, migraines, marijuana abuse who presents the emergency department at [facility] with complaints of intractable nausea and vomiting.  This is her third visit in last 5 days.  She says this has been ongoing for last 1 week.  She is not able to tolerate anything by mouth.  Also complaining of severe headache and presentation. She was found to have severe hypokalemia.  CT abdomen/pelvis done on 10/29/2018 does not show any acute intra-abdominal abnormalities. Patient last marijuana intake was 3 days ago.  She states she has history of migraines and intermittent abdominal pain so she takes marijuana to relieve herself because she does not have insurance to go doctors for medications.  She clearly denies marijuana as the cause of her nausea and vomiting at present. Patient seen and examined the bedside.  Denies any chest pain, cough, shortness of breath, dysuria, fever, chills.  She states she is feeling little better than this morning. COVID-19 test done at [facility] is negative.  ED Course: CT findings as above.  Being supplemented with IV potassium.  Found to have mild leukocytosis.  UDS positive for tetrahydrocannabinol.  Review of Systems: As per HPI otherwise 10 point review of systems negative.    Past Medical History:  Diagnosis Date  . Asthma   . Depression   . Migraine   . Seasonal allergies     Past Surgical History:  Procedure Laterality Date  . ABDOMINAL HYSTERECTOMY    . CESAREAN SECTION     X 2  . CHOLECYSTECTOMY    . NASAL SINUS SURGERY       reports that she has been smoking. She has never used smokeless tobacco. She reports current alcohol use. She reports that she does not use  drugs.  Allergies  Allergen Reactions  . Xanax [Alprazolam]     Becomes violent    No family history on file.   Prior to Admission medications   Medication Sig Start Date End Date Taking? Authorizing Provider  atenolol (TENORMIN) 25 MG tablet Take by mouth. 11/02/17  Yes [provider]  atorvastatin (LIPITOR) 40 MG tablet Take by mouth. 03/25/16  Yes [provider]  butalbital-acetaminophen-caffeine (FIORICET) 50-325-40 MG tablet Take by mouth. 03/25/16  Yes [provider]  albuterol (PROVENTIL) (5 MG/ML) 0.5% nebulizer solution Take 1 mL (5 mg total) by nebulization every 4 (four) hours as needed for wheezing. 09/09/12   Calvert Cantor, MD  ALBUTEROL IN Inhale into the lungs as needed.    [provider]  baclofen (LIORESAL) 10 MG tablet Take by mouth.    [provider]  ClonazePAM (KLONOPIN PO) Take by mouth as needed.    [provider]  dicyclomine (BENTYL) 20 MG tablet Take 1 tablet (20 mg total) by mouth 2 (two) times daily. 10/29/18   Valarie Merino, MD  Meclizine HCl 25 MG CHEW  09/15/18   [provider]  Montelukast Sodium (SINGULAIR PO) Take by mouth.    [provider]  predniSONE (DELTASONE) 20 MG tablet Take 3 tablets (60 mg total) by mouth daily. 09/09/12   Calvert Cantor, MD  promethazine (PHENERGAN) 25 MG tablet Take 1 tablet (25 mg total) by mouth every 6 (six) hours  as needed for nausea or vomiting. 10/29/18   Wynetta FinesMessick, Peter C, MD    Physical Exam: Vitals:   10/31/18 0629 10/31/18 0634 10/31/18 0839  BP:  (!) 174/93 (!) 176/107  Pulse:  (!) 55 66  Resp:  (!) 22 (!) 26  Temp:  98.3 F (36.8 C)   TempSrc:  Oral   SpO2:  100% 100%  Weight: 60.8 kg    Height: 5\' 3"  (1.6 m)      Constitutional: Not in distress Vitals:   10/31/18 0629 10/31/18 0634 10/31/18 0839  BP:  (!) 174/93 (!) 176/107  Pulse:  (!) 55 66  Resp:  (!) 22 (!) 26  Temp:  98.3 F (36.8 C)   TempSrc:  Oral   SpO2:   100% 100%  Weight: 60.8 kg    Height: 5\' 3"  (1.6 m)     Eyes: PERRL, lids and conjunctivae normal ENMT: Mucous membranes are moist. Posterior pharynx clear of any exudate or lesions.  Neck: normal, supple, no masses, no thyromegaly Respiratory: clear to auscultation bilaterally, no wheezing, no crackles. Normal respiratory effort. No accessory muscle use.  Cardiovascular: Regular rate and rhythm, no murmurs / rubs / gallops. No extremity edema. 2+ pedal pulses. No carotid bruits.  Abdomen: Mild diffuse  tenderness, no masses palpated. No hepatosplenomegaly. Bowel sounds positive.  Musculoskeletal: no clubbing / cyanosis. No joint deformity upper and lower extremities. Good ROM, no contractures. Normal muscle tone.  Skin: no rashes, lesions, ulcers. No induration Neurologic: CN 2-12 grossly intact. Sensation intact, DTR normal. Strength 5/5 in all 4.  Psychiatric: Normal judgment and insight. Alert and oriented x 3. Normal mood.   Foley Catheter:None  Labs on Admission: I have personally reviewed following labs and imaging studies  CBC: Recent Labs  Lab 10/29/18 0810 10/31/18 0659  WBC 13.3* 16.0*  NEUTROABS 11.3* 13.4*  HGB 15.1* 15.8*  HCT 43.2 45.7  MCV 84.2 85.3  PLT 305 329   Basic Metabolic Panel: Recent Labs  Lab 10/29/18 0810 10/31/18 0659  NA 138 138  K 2.8* 2.8*  CL 105 102  CO2 22 22  GLUCOSE 135* 126*  BUN <5* 8  CREATININE 0.83 0.89  CALCIUM 9.3 9.4  MG  --  2.0   GFR: Estimated Creatinine Clearance: 63.3 mL/min (by C-G formula based on SCr of 0.89 mg/dL). Liver Function Tests: Recent Labs  Lab 10/29/18 0810 10/31/18 0659  AST 17 18  ALT 17 17  ALKPHOS 55 55  BILITOT 0.6 0.8  PROT 7.0 8.1  ALBUMIN 4.3 4.9   Recent Labs  Lab 10/29/18 0810 10/31/18 0659  LIPASE 28 29   No results for input(s): AMMONIA in the last 168 hours. Coagulation Profile: No results for input(s): INR, PROTIME in the last 168 hours. Cardiac Enzymes: No results for  input(s): CKTOTAL, CKMB, CKMBINDEX, TROPONINI in the last 168 hours. BNP (last 3 results) No results for input(s): PROBNP in the last 8760 hours. HbA1C: No results for input(s): HGBA1C in the last 72 hours. CBG: No results for input(s): GLUCAP in the last 168 hours. Lipid Profile: No results for input(s): CHOL, HDL, LDLCALC, TRIG, CHOLHDL, LDLDIRECT in the last 72 hours. Thyroid Function Tests: No results for input(s): TSH, T4TOTAL, FREET4, T3FREE, THYROIDAB in the last 72 hours. Anemia Panel: No results for input(s): VITAMINB12, FOLATE, FERRITIN, TIBC, IRON, RETICCTPCT in the last 72 hours. Urine analysis:    Component Value Date/Time   COLORURINE YELLOW 10/29/2018 0930   APPEARANCEUR CLEAR 10/29/2018 0930  LABSPEC 1.015 10/29/2018 0930   PHURINE 7.0 10/29/2018 0930   GLUCOSEU NEGATIVE 10/29/2018 0930   HGBUR SMALL (A) 10/29/2018 0930   BILIRUBINUR NEGATIVE 10/29/2018 0930   KETONESUR NEGATIVE 10/29/2018 0930   PROTEINUR NEGATIVE 10/29/2018 0930   NITRITE NEGATIVE 10/29/2018 0930   LEUKOCYTESUR NEGATIVE 10/29/2018 0930    Radiological Exams on Admission: Ct Abdomen Pelvis W Contrast  Result Date: 10/29/2018 CLINICAL DATA:  Abdominal pain, nausea and vomiting for several days EXAM: CT ABDOMEN AND PELVIS WITH CONTRAST TECHNIQUE: Multidetector CT imaging of the abdomen and pelvis was performed using the standard protocol following bolus administration of intravenous contrast. CONTRAST:  100mL OMNIPAQUE IOHEXOL 300 MG/ML  SOLN COMPARISON:  09/14/2018 CT abdomen/pelvis. FINDINGS: Lower chest: No significant pulmonary nodules or acute consolidative airspace disease. Hepatobiliary: Normal liver size. No liver mass. Cholecystectomy. No biliary ductal dilatation. Pancreas: Normal, with no mass or duct dilation. Spleen: Normal size. No mass. Adrenals/Urinary Tract: Normal adrenals. A few scattered subcentimeter hypodense renal cortical lesions in both kidneys are too small to characterize and  are unchanged, requiring no follow-up. Fullness of the central renal collecting systems bilaterally, without overt hydronephrosis. Contrast nephrograms are symmetric and normal. Normal bladder. Stomach/Bowel: Normal non-distended stomach. Normal caliber small bowel with no small bowel wall thickening. Normal appendix. Normal large bowel with no diverticulosis, large bowel wall thickening or pericolonic fat stranding. Vascular/Lymphatic: Atherosclerotic nonaneurysmal abdominal aorta. Patent portal, splenic, hepatic and renal veins. No pathologically enlarged lymph nodes in the abdomen or pelvis. Reproductive: Status post hysterectomy, with no abnormal findings at the vaginal cuff. No adnexal mass. Other: No pneumoperitoneum, ascites or focal fluid collection. Musculoskeletal: No aggressive appearing focal osseous lesions. Mild thoracolumbar spondylosis. IMPRESSION: 1. No acute abnormality. No evidence of bowel obstruction or acute bowel inflammation. Normal appendix. 2.  Aortic Atherosclerosis (ICD10-I70.0). Electronically Signed   By: Delbert PhenixJason A Poff M.D.   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  Last marijuana intake 3 days ago.  Patient clearly denies this and states she has been taking marijuana for last 21 years. Continue IV fluids, Zofran for nausea.    I have counseled for cessation of marijuana use  Severe hypokalemia: Supplemented with potassium.  Magnesium normal  Hypertension: Takes atenolol at home.  Continue her home meds.  Monitor blood pressure.  Mildly hypertensive on presentation.  Hyperlipidemia: On Lipitor.  Intermittent asthma: Stable.  On home inhalers and nebulization, Singulair.  Continue bronchodilators.  Migraine: Takes Fioricet at home.  Minimize narcotics.  Leukocytosis: Most likely reactive.  Continue to monitor.   No indication of antibiotics.   Severity of Illness: The appropriate patient status for this patient is OBSERVATION.     DVT prophylaxis: Lovenox Code Status: Full Family Communication: None present at the bedside Consults called: None     Burnadette PopAmrit Britania Shreeve MD [facility] Pager [phone]  If 7PM-7AM, please contact night-coverage [email or web address] Password [email or web address]  10/31/2018, 9:06 AM

## 2018-10-31 NOTE — ED Provider Notes (Signed)
[facility] EMERGENCY DEPARTMENT Provider Note   CSN: [id number] Arrival date & time: 10/31/18  1962     History   Chief Complaint Chief Complaint  Patient presents with  . Abdominal Pain    HPI Joann Colon is a 48 y.o. female.     HPI  This is a 49 year old female with a history of migraines who presents with abdominal pain.  Patient reports diffuse abdominal pain over the last 4 to 5 days.  She reports multiple episodes of nonbilious, nonbloody emesis.  She states "you just need to admit me."  She reports she has been seen twice in the last 4 days and "they make me better and then I get worse."  She had been smoking daily marijuana for her migraines but reports that she has not smoked in 1 week.  She reports headache consistent with her prior migraines as well as a domino pain.  She rates her abdominal pain at 10 out of 10.  She reports normal bowel movements.  She denies any fever, cough, shortness of breath.  Denies weakness, numbness, vision changes, speech difficulty.  Patient's chart reviewed.  She was seen at [facility] on 6/19.  It was discussed with her at that time that her marijuana may be contributing to her symptoms.  She was seen on 6/21 for the same symptoms and had a work-up including a CT scan that was negative.  She does have mild hypokalemia which was replaced.  Past Medical History:  Diagnosis Date  . Asthma   . Depression   . Migraine   . Seasonal allergies     There are no active problems to display for this patient.   Past Surgical History:  Procedure Laterality Date  . ABDOMINAL HYSTERECTOMY    . CESAREAN SECTION     X 2  . CHOLECYSTECTOMY    . NASAL SINUS SURGERY       OB History   No obstetric history on file.      Home Medications    Prior to Admission medications   Medication Sig Start Date End Date Taking? Authorizing Provider  atenolol (TENORMIN) 25 MG tablet Take by mouth. 11/02/17  Yes [provider]  atorvastatin (LIPITOR) 40 MG tablet Take by mouth. 03/25/16  Yes [provider]  butalbital-acetaminophen-caffeine (FIORICET) 50-325-40 MG tablet Take by mouth. 03/25/16  Yes [provider]  albuterol (PROVENTIL) (5 MG/ML) 0.5% nebulizer solution Take 1 mL (5 mg total) by nebulization every 4 (four) hours as needed for wheezing. 09/09/12   Calvert Cantor, MD  ALBUTEROL IN Inhale into the lungs as needed.    [provider]  baclofen (LIORESAL) 10 MG tablet Take by mouth.    [provider]  ClonazePAM (KLONOPIN PO) Take by mouth as needed.    [provider]  dicyclomine (BENTYL) 20 MG tablet Take 1 tablet (20 mg total) by mouth 2 (two) times daily. 10/29/18   Valarie Merino, MD  Meclizine HCl 25 MG CHEW  09/15/18   [provider]  Montelukast Sodium (SINGULAIR PO) Take by mouth.    [provider]  predniSONE (DELTASONE) 20 MG tablet Take 3 tablets (60 mg total) by mouth daily. 09/09/12   Calvert Cantor, MD  promethazine (PHENERGAN) 25 MG tablet Take 1 tablet (25 mg total) by mouth every 6 (six) hours as needed for nausea or vomiting. 10/29/18   Valarie Merino, MD    Family History No family history on  file.  Social History Social History   Tobacco Use  . Smoking status: Current Some Day Smoker  . Smokeless tobacco: Never Used  Substance Use Topics  . Alcohol use: Yes  . Drug use: No     Allergies   Xanax [alprazolam]   Review of Systems Review of Systems  Constitutional: Negative for fever.  Respiratory: Negative for shortness of breath.   Cardiovascular: Negative for chest pain.  Gastrointestinal: Positive for abdominal pain, nausea and vomiting. Negative for constipation and diarrhea.  Genitourinary: Negative for dysuria.  Musculoskeletal: Negative for back pain.  Neurological: Positive for headaches. Negative for weakness and numbness.  All other systems reviewed and are negative.    Physical Exam  Updated Vital Signs BP (!) 174/93 (BP Location: Right Arm)   Pulse (!) 55   Temp 98.3 F (36.8 C) (Oral)   Resp (!) 22   Ht 1.6 m (5\' 3" )   Wt 60.8 kg   SpO2 100%   BMI 23.74 kg/m   Physical Exam Vitals signs and nursing note reviewed.  Constitutional:      Appearance: She is well-developed. She is not ill-appearing.     Comments: Anxious but non-ill-appearing  HENT:     Head: Normocephalic and atraumatic.     Mouth/Throat:     Mouth: Mucous membranes are moist.  Eyes:     Pupils: Pupils are equal, round, and reactive to light.  Neck:     Musculoskeletal: Neck supple.  Cardiovascular:     Rate and Rhythm: Normal rate and regular rhythm.     Heart sounds: Normal heart sounds.  Pulmonary:     Effort: Pulmonary effort is normal. No respiratory distress.     Breath sounds: No wheezing.  Abdominal:     General: Bowel sounds are normal.     Palpations: Abdomen is soft.     Tenderness: There is generalized abdominal tenderness. There is no guarding or rebound.  Skin:    General: Skin is warm and dry.  Neurological:     Mental Status: She is alert and oriented to person, place, and time.  Psychiatric:        Mood and Affect: Mood is anxious.      ED Treatments / Results  Labs (all labs ordered are listed, but only abnormal results are displayed) Labs Reviewed  CBC WITH DIFFERENTIAL/PLATELET  COMPREHENSIVE METABOLIC PANEL  LIPASE, BLOOD  URINALYSIS, ROUTINE W REFLEX MICROSCOPIC    EKG    Radiology Ct Abdomen Pelvis W Contrast  Result Date: 10/29/2018 CLINICAL DATA:  Abdominal pain, nausea and vomiting for several days EXAM: CT ABDOMEN AND PELVIS WITH CONTRAST TECHNIQUE: Multidetector CT imaging of the abdomen and pelvis was performed using the standard protocol following bolus administration of intravenous contrast. CONTRAST:  100mL OMNIPAQUE IOHEXOL 300 MG/ML  SOLN COMPARISON:  09/14/2018 CT abdomen/pelvis. FINDINGS: Lower chest: No significant pulmonary nodules  or acute consolidative airspace disease. Hepatobiliary: Normal liver size. No liver mass. Cholecystectomy. No biliary ductal dilatation. Pancreas: Normal, with no mass or duct dilation. Spleen: Normal size. No mass. Adrenals/Urinary Tract: Normal adrenals. A few scattered subcentimeter hypodense renal cortical lesions in both kidneys are too small to characterize and are unchanged, requiring no follow-up. Fullness of the central renal collecting systems bilaterally, without overt hydronephrosis. Contrast nephrograms are symmetric and normal. Normal bladder. Stomach/Bowel: Normal non-distended stomach. Normal caliber small bowel with no small bowel wall thickening. Normal appendix. Normal large bowel with no diverticulosis, large bowel wall thickening or pericolonic fat  stranding. Vascular/Lymphatic: Atherosclerotic nonaneurysmal abdominal aorta. Patent portal, splenic, hepatic and renal veins. No pathologically enlarged lymph nodes in the abdomen or pelvis. Reproductive: Status post hysterectomy, with no abnormal findings at the vaginal cuff. No adnexal mass. Other: No pneumoperitoneum, ascites or focal fluid collection. Musculoskeletal: No aggressive appearing focal osseous lesions. Mild thoracolumbar spondylosis. IMPRESSION: 1. No acute abnormality. No evidence of bowel obstruction or acute bowel inflammation. Normal appendix. 2.  Aortic Atherosclerosis (ICD10-I70.0). Electronically Signed   By: Delbert PhenixJason A Poff M.D.   On: 10/29/2018 10:37    Procedures Procedures (including critical care time)  Medications Ordered in ED Medications  sodium chloride 0.9 % bolus 1,000 mL (has no administration in time range)  capsaicin (ZOSTRIX) 0.025 % cream (has no administration in time range)  ondansetron (ZOFRAN) injection 4 mg (has no administration in time range)     Initial Impression / Assessment and Plan / ED Course  I have reviewed the triage vital signs and the nursing notes.  Pertinent labs & imaging  results that were available during my care of the patient were reviewed by me and considered in my medical decision making (see chart for details).        Presents with abdominal pain.  Seen and evaluated twice in the last week for the same.  She had a CT scan that was reassuring.  She denies recent marijuana use but history suggestive of possible cyclic vomiting related to marijuana.  Patient was given Haldol and capsaicin.  Lab work obtained.  Patient signed out to oncoming provider.  Final Clinical Impressions(s) / ED Diagnoses   Final diagnoses:  None    ED Discharge Orders    None       Horton, Mayer Maskerourtney F, MD 11/01/18 [phone]

## 2018-10-31 NOTE — Progress Notes (Signed)
48 year old female with history of hypertension, asthma, migraines, marijuana abuse who presents the emergency department at [facility] with complaints of intractable nausea and vomiting.  This is her third visit in last 5 days.  She was found to have severe hypokalemia.  CT abdomen/pelvis done on 10/29/2018 does not show any acute intra-abdominal abnormalities.  Potassium of 2.8.  Patient unable to tolerate anything by mouth.  Accepted for observation.

## 2018-10-31 NOTE — ED Notes (Signed)
Pt amb to [facility]

## 2018-10-31 NOTE — ED Triage Notes (Signed)
Patient complains of upper left quad abd pain onset 8 days ago; complains of nausea and vomiting with last emesis pta that is bright yellow in color per patient. Patient seen recently at [facility] ED and [facility] ED for same.

## 2018-11-01 DIAGNOSIS — R112 Nausea with vomiting, unspecified: Secondary | ICD-10-CM

## 2018-11-01 DIAGNOSIS — G4440 Drug-induced headache, not elsewhere classified, not intractable: Secondary | ICD-10-CM

## 2018-11-01 DIAGNOSIS — J45909 Unspecified asthma, uncomplicated: Secondary | ICD-10-CM

## 2018-11-01 DIAGNOSIS — F1210 Cannabis abuse, uncomplicated: Secondary | ICD-10-CM

## 2018-11-01 DIAGNOSIS — I10 Essential (primary) hypertension: Secondary | ICD-10-CM

## 2018-11-01 LAB — CBC
HCT: 40.9 % (ref 36.0–46.0)
Hemoglobin: 13.6 g/dL (ref 12.0–15.0)
MCH: 29.8 pg (ref 26.0–34.0)
MCHC: 33.3 g/dL (ref 30.0–36.0)
MCV: 89.5 fL (ref 80.0–100.0)
Platelets: 268 10*3/uL (ref 150–400)
RBC: 4.57 MIL/uL (ref 3.87–5.11)
RDW: 13.3 % (ref 11.5–15.5)
WBC: 14.4 10*3/uL — ABNORMAL HIGH (ref 4.0–10.5)
nRBC: 0 % (ref 0.0–0.2)

## 2018-11-01 LAB — BASIC METABOLIC PANEL
Anion gap: 9 (ref 5–15)
BUN: 7 mg/dL (ref 6–20)
CO2: 20 mmol/L — ABNORMAL LOW (ref 22–32)
Calcium: 8.9 mg/dL (ref 8.9–10.3)
Chloride: 108 mmol/L (ref 98–111)
Creatinine, Ser: 0.76 mg/dL (ref 0.44–1.00)
GFR calc Af Amer: >60 mL/min (ref >60)
GFR calc non Af Amer: >60 mL/min (ref >60)
Glucose, Bld: 98 mg/dL (ref 70–99)
Potassium: 3.8 mmol/L (ref 3.5–5.1)
Sodium: 137 mmol/L (ref 135–145)

## 2018-11-01 LAB — HIV ANTIBODY (ROUTINE TESTING W REFLEX): HIV Screen 4th Generation wRfx: NONREACTIVE

## 2018-11-01 MED ORDER — OXYCODONE HCL 5 MG PO TABS
5.0000 mg | ORAL_TABLET | Freq: Once | ORAL | Status: AC
  Administered 2018-11-01: 5 mg via ORAL
  Filled 2018-11-01: qty 1

## 2018-11-01 MED ORDER — PROCHLORPERAZINE EDISYLATE 10 MG/2ML IJ SOLN
5.0000 mg | Freq: Once | INTRAMUSCULAR | Status: AC
  Administered 2018-11-01: 06:00:00 5 mg via INTRAVENOUS
  Filled 2018-11-01: qty 2

## 2018-11-01 MED ORDER — ONDANSETRON HCL 4 MG PO TABS: 4.0000 mg | ORAL_TABLET | Freq: Every day | ORAL | 1 refills | Status: AC | PRN

## 2018-11-01 NOTE — Plan of Care (Signed)

## 2018-11-01 NOTE — Discharge Summary (Signed)
Physician Discharge Summary  Joann RosenthalKaren Pol Colon:[id number]:[id number] DOB: 10/26/1970 DOA: 10/31/2018  PCP: Joann GalYbanez, Jane, MD  Admit date: 10/31/2018 Discharge date: 11/01/2018  Admitted From: Observation Disposition: home  Recommendations for Outpatient Follow-up:  1. Follow up with PCP in 1-2 weeks 2. F/up with PCP as discussed  Home Health:No Equipment/Devices:none  Discharge Condition:Stable CODE STATUS:Full code Diet recommendation: Regular healthy diet  Brief/Interim Summary:  Joann RosenthalKaren Colon is a 48 y.o. female with medical history significant of hypertension, asthma, migraines, marijuana abuse who presents [facility] emergency department at [facility] with complaints of intractable nausea and vomiting.This is her third visit in last 5 days.  She says this has been ongoing for last 1 week.  She is not able to tolerate anything by mouth.  Also complaining of severe headache and presentation.She was found to have severe hypokalemia. CT abdomen/pelvis done on 10/29/2018 does not show any acute intra-abdominal abnormalities. Patient last marijuana intake was 3 days ago.  She states she has history of migraines and intermittent abdominal pain so she takes marijuana to relieve herself because she does not have insurance to go doctors for medications.  She clearly denies marijuana as the cause of her nausea and vomiting at present. Patient seen and examined the bedside.  Denies any chest pain, cough, shortness of breath, dysuria, fever, chills.  She states she is feeling little better than this morning. [facility]  ED Course: CT findings as above.  Being supplemented with IV potassium.  Found to have mild leukocytosis.  UDS positive for tetrahydrocannabinol.  Hospital course Intractable nausea and vomiting with abdominal pain.  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  Patient's vomiting has resolved she has mild nausea abdominal pain is  cleared.  Patient did asked if she be discharged today and did ask for pain medications which I referred to her PCP for outpatient management.  Patient's blood pressure was noted to be elevated secondary to inability to take medications.  Blood pressure has improved with taking medications.  She she has had underlying anxiety which driving some of her blood pressure readings.  Continue home medications without acute change.  Should continue her Lipitor for hyperlipidemia.  Her potassium was repleted.  Patient does take Fioricet at home she will continue as an outpatient.  She also sees a neurologist for her migraines.  She will continue to follow with them as an outpatient.  I recommended against opioid treatments for migraines.  Discharge Diagnoses:  Principal Problem:   Nausea & vomiting Active Problems:   Headache   Essential hypertension   Marijuana abuse   Asthma   Intractable nausea and vomiting    Discharge Instructions  Discharge Instructions    Call MD for:   Complete by: As directed    Any acute change in medical condition   Diet - low sodium heart healthy   Complete by: As directed    Increase activity slowly   Complete by: As directed      DISCHARGE MEDICATIONS PER MED REC   Allergies  Allergen Reactions  . Xanax [Alprazolam]     Becomes violent  . Aspartame Nausea Only  . Capsaicin Rash    Rash and Burning and Peeling.    Consultations:  None   Procedures/Studies: Ct Abdomen Pelvis W Contrast  Result Date: 10/29/2018 CLINICAL DATA:  Abdominal pain, nausea and vomiting for several days EXAM: CT ABDOMEN AND PELVIS WITH CONTRAST TECHNIQUE: Multidetector CT imaging of the  abdomen and pelvis was performed using the standard protocol following bolus administration of intravenous contrast. CONTRAST:  128mL OMNIPAQUE IOHEXOL 300 MG/ML  SOLN COMPARISON:  09/14/2018 CT abdomen/pelvis. FINDINGS: Lower chest: No significant pulmonary nodules or acute consolidative  airspace disease. Hepatobiliary: Normal liver size. No liver mass. Cholecystectomy. No biliary ductal dilatation. Pancreas: Normal, with no mass or duct dilation. Spleen: Normal size. No mass. Adrenals/Urinary Tract: Normal adrenals. A few scattered subcentimeter hypodense renal cortical lesions in both kidneys are too small to characterize and are unchanged, requiring no follow-up. Fullness of the central renal collecting systems bilaterally, without overt hydronephrosis. Contrast nephrograms are symmetric and normal. Normal bladder. Stomach/Bowel: Normal non-distended stomach. Normal caliber small bowel with no small bowel wall thickening. Normal appendix. Normal large bowel with no diverticulosis, large bowel wall thickening or pericolonic fat stranding. Vascular/Lymphatic: Atherosclerotic nonaneurysmal abdominal aorta. Patent portal, splenic, hepatic and renal veins. No pathologically enlarged lymph nodes in the abdomen or pelvis. Reproductive: Status post hysterectomy, with no abnormal findings at the vaginal cuff. No adnexal mass. Other: No pneumoperitoneum, ascites or focal fluid collection. Musculoskeletal: No aggressive appearing focal osseous lesions. Mild thoracolumbar spondylosis. IMPRESSION: 1. No acute abnormality. No evidence of bowel obstruction or acute bowel inflammation. Normal appendix. 2.  Aortic Atherosclerosis (ICD10-I70.0). Electronically Signed   By: Ilona Sorrel M.D.   On: 10/29/2018 10:37       Subjective: Patient reports vomiting is improved still mild nausea abdominal pain improving.  Did request pain medications on discharge   Discharge Exam: Vitals:   10/31/18 2004 11/01/18 0520  BP: (!) 147/88 (!) 194/96  Pulse: (!) 55 (!) 45  Resp: 20 (!) 23  Temp: 98.2 F (36.8 C) 97.7 F (36.5 C)  SpO2: 96% 100%   Vitals:   10/31/18 1600 10/31/18 1734 10/31/18 2004 11/01/18 0520  BP:   (!) 147/88 (!) 194/96  Pulse:  62 (!) 55 (!) 45  Resp:   20 (!) 23  Temp:   98.2 F (36.8  C) 97.7 F (36.5 C)  TempSrc:   Oral Oral  SpO2:   96% 100%  Weight: 60.8 kg     Height: 5' 2.99" (1.6 m)       General: Pt is alert, awake, not in acute distress Cardiovascular: RRR, S1/S2 +, no rubs, no gallops Respiratory: CTA bilaterally, no wheezing, no rhonchi Abdominal: Soft, NT, ND, bowel sounds + Extremities: no edema, no cyanosis    The results of significant diagnostics from this hospitalization (including imaging, microbiology, ancillary and laboratory) are listed below for reference.     Microbiology: Recent Results (from the past 240 hour(s))  SARS Coronavirus 2 (Hosp order,Performed in [facility] lab via Abbott ID)     Status: None   Collection Time: 10/31/18  9:00 AM   Specimen: Dry Nasal Swab (Abbott ID Now)  Result Value Ref Range Status   SARS Coronavirus 2 (Abbott ID Now) NEGATIVE NEGATIVE Final    Comment: (NOTE) SARS-CoV-2 target nucleic acids are NOT DETECTED. The SARS-CoV-2 RNA is generally detectable in upper and lower respiratory specimens during the acute phase of infection.  Negativeresults do not preclude SARS-CoV-2 infection, do not rule out coinfections with other pathogens, and should not be used as the  sole basis for treatment or other patient management decisions.  Negative results must be combined with clinical observations, patient history, and epidemiological information. The expected result is Negative. Fact Sheet for Patients: GolfingFamily.no Fact Sheet for Healthcare Providers: https://www.hernandez-brewer.com/ This test is not yet approved  or cleared by the [place] FDA and  has been authorized for detection and/or diagnosis of SARS-CoV-2 by FDA under an Emergency Use Authorization (EUA).  This EUA will remain in effect (meaning this test can be used) for the duration of  the COVID19 declaration under Section 5 64(b)(1) of the Act, 21 U.S.C.  section [id number] 3(b)(1), unless the  authorization is terminated or revoked sooner. Performed at [facility] [place] [facility], [address] [place], [place] [address]   SARS Coronavirus 2 (CEPHEID - Performed in [facility] hospital lab), Hosp Order     Status: None   Collection Time: 10/31/18  2:37 PM   Specimen: Nasopharyngeal Swab  Result Value Ref Range Status   SARS Coronavirus 2 NEGATIVE NEGATIVE Final    Comment: (NOTE) If result is NEGATIVE SARS-CoV-2 target nucleic acids are NOT DETECTED. The SARS-CoV-2 RNA is generally detectable in upper and lower  respiratory specimens during the acute phase of infection. The lowest  concentration of SARS-CoV-2 viral copies this assay can detect is 250  copies / mL. A negative result does not preclude SARS-CoV-2 infection  and should not be used as the sole basis for treatment or other  patient management decisions.  A negative result may occur with  improper specimen collection / handling, submission of specimen other  than nasopharyngeal swab, presence of viral mutation(s) within the  areas targeted by this assay, and inadequate number of viral copies  (<250 copies / mL). A negative result must be combined with clinical  observations, patient history, and epidemiological information. If result is POSITIVE SARS-CoV-2 target nucleic acids are DETECTED. The SARS-CoV-2 RNA is generally detectable in upper and lower  respiratory specimens dur ing the acute phase of infection.  Positive  results are indicative of active infection with SARS-CoV-2.  Clinical  correlation with patient history and other diagnostic information is  necessary to determine patient infection status.  Positive results do  not rule out bacterial infection or co-infection with other viruses. If result is PRESUMPTIVE POSTIVE SARS-CoV-2 nucleic acids MAY BE PRESENT.   A presumptive positive result was obtained on the submitted specimen  and confirmed on repeat testing.  While 2019 novel coronavirus   (SARS-CoV-2) nucleic acids may be present in the submitted sample  additional confirmatory testing may be necessary for epidemiological  and / or clinical management purposes  to differentiate between  SARS-CoV-2 and other Sarbecovirus currently known to infect humans.  If clinically indicated additional testing with an alternate test  methodology [phone]([id number]) is advised. The SARS-CoV-2 RNA is generally  detectable in upper and lower respiratory sp ecimens during the acute  phase of infection. The expected result is Negative. Fact Sheet for Patients:  BoilerBrush.com.cyhttps://www.fda.gov/media/136312/download Fact Sheet for Healthcare Providers: https://pope.com/https://www.fda.gov/media/136313/download This test is not yet approved or cleared by the [place] FDA and has been authorized for detection and/or diagnosis of SARS-CoV-2 by FDA under an Emergency Use Authorization (EUA).  This EUA will remain in effect (meaning this test can be used) for the duration of the COVID-19 declaration under Section 564(b)(1) of the Act, 21 U.S.C. section 360bbb-3(b)(1), unless the authorization is terminated or revoked sooner. Performed at [facility], [address] [place], [place] [address]      Labs: BNP (last 3 results) No results for input(s): BNP in the last 8760 hours. Basic Metabolic Panel: Recent Labs  Lab 10/29/18 0810 10/31/18 0659 10/31/18 1452 11/01/18 0552  NA 138 138  --  137  K  2.8* 2.8* 3.4* 3.8  CL 105 102  --  108  CO2 22 22  --  20*  GLUCOSE 135* 126*  --  98  BUN <5* 8  --  7  CREATININE 0.83 0.89  --  0.76  CALCIUM 9.3 9.4  --  8.9  MG  --  2.0  --   --    Liver Function Tests: Recent Labs  Lab 10/29/18 0810 10/31/18 0659  AST 17 18  ALT 17 17  ALKPHOS 55 55  BILITOT 0.6 0.8  PROT 7.0 8.1  ALBUMIN 4.3 4.9   Recent Labs  Lab 10/29/18 0810 10/31/18 0659  LIPASE 28 29   No results for input(s): AMMONIA in the last 168 hours. CBC: Recent Labs  Lab  10/29/18 0810 10/31/18 0659 11/01/18 0830  WBC 13.3* 16.0* 14.4*  NEUTROABS 11.3* 13.4*  --   HGB 15.1* 15.8* 13.6  HCT 43.2 45.7 40.9  MCV 84.2 85.3 89.5  PLT 305 329 268   Cardiac Enzymes: No results for input(s): CKTOTAL, CKMB, CKMBINDEX, TROPONINI in the last 168 hours. BNP: Invalid input(s): POCBNP CBG: No results for input(s): GLUCAP in the last 168 hours. D-Dimer No results for input(s): DDIMER in the last 72 hours. Hgb A1c No results for input(s): HGBA1C in the last 72 hours. Lipid Profile No results for input(s): CHOL, HDL, LDLCALC, TRIG, CHOLHDL, LDLDIRECT in the last 72 hours. Thyroid function studies No results for input(s): TSH, T4TOTAL, T3FREE, THYROIDAB in the last 72 hours.  Invalid input(s): FREET3 Anemia work up No results for input(s): VITAMINB12, FOLATE, FERRITIN, TIBC, IRON, RETICCTPCT in the last 72 hours. Urinalysis    Component Value Date/Time   COLORURINE YELLOW 10/29/2018 0930   APPEARANCEUR CLEAR 10/29/2018 0930   LABSPEC 1.015 10/29/2018 0930   PHURINE 7.0 10/29/2018 0930   GLUCOSEU NEGATIVE 10/29/2018 0930   HGBUR SMALL (A) 10/29/2018 0930   BILIRUBINUR NEGATIVE 10/29/2018 0930   KETONESUR NEGATIVE 10/29/2018 0930   PROTEINUR NEGATIVE 10/29/2018 0930   NITRITE NEGATIVE 10/29/2018 0930   LEUKOCYTESUR NEGATIVE 10/29/2018 0930   Sepsis Labs Invalid input(s): PROCALCITONIN,  WBC,  LACTICIDVEN Microbiology Recent Results (from the past 240 hour(s))  SARS Coronavirus 2 (Hosp order,Performed in [facility] lab via Abbott ID)     Status: None   Collection Time: 10/31/18  9:00 AM   Specimen: Dry Nasal Swab (Abbott ID Now)  Result Value Ref Range Status   SARS Coronavirus 2 (Abbott ID Now) NEGATIVE NEGATIVE Final    Comment: (NOTE) SARS-CoV-2 target nucleic acids are NOT DETECTED. The SARS-CoV-2 RNA is generally detectable in upper and lower respiratory specimens during the acute phase of infection.  Negativeresults do not preclude  SARS-CoV-2 infection, do not rule out coinfections with other pathogens, and should not be used as the  sole basis for treatment or other patient management decisions.  Negative results must be combined with clinical observations, patient history, and epidemiological information. The expected result is Negative. Fact Sheet for Patients: http://www.graves-ford.org/ Fact Sheet for Healthcare Providers: EnviroConcern.si This test is not yet approved or cleared by the [place] FDA and  has been authorized for detection and/or diagnosis of SARS-CoV-2 by FDA under an Emergency Use Authorization (EUA).  This EUA will remain in effect (meaning this test can be used) for the duration of  the COVID19 declaration under Section 5 64(b)(1) of the Act, 21 U.S.C.  section [phone] 3(b)(1), unless the authorization is terminated or revoked sooner. Performed at [facility],  [address] [place], [place] [address]   SARS Coronavirus 2 (CEPHEID - Performed in [facility] hospital lab), Hosp Order     Status: None   Collection Time: 10/31/18  2:37 PM   Specimen: Nasopharyngeal Swab  Result Value Ref Range Status   SARS Coronavirus 2 NEGATIVE NEGATIVE Final    Comment: (NOTE) If result is NEGATIVE SARS-CoV-2 target nucleic acids are NOT DETECTED. The SARS-CoV-2 RNA is generally detectable in upper and lower  respiratory specimens during the acute phase of infection. The lowest  concentration of SARS-CoV-2 viral copies this assay can detect is 250  copies / mL. A negative result does not preclude SARS-CoV-2 infection  and should not be used as the sole basis for treatment or other  patient management decisions.  A negative result may occur with  improper specimen collection / handling, submission of specimen other  than nasopharyngeal swab, presence of viral mutation(s) within the  areas targeted by this assay, and inadequate number of viral  copies  (<250 copies / mL). A negative result must be combined with clinical  observations, patient history, and epidemiological information. If result is POSITIVE SARS-CoV-2 target nucleic acids are DETECTED. The SARS-CoV-2 RNA is generally detectable in upper and lower  respiratory specimens dur ing the acute phase of infection.  Positive  results are indicative of active infection with SARS-CoV-2.  Clinical  correlation with patient history and other diagnostic information is  necessary to determine patient infection status.  Positive results do  not rule out bacterial infection or co-infection with other viruses. If result is PRESUMPTIVE POSTIVE SARS-CoV-2 nucleic acids MAY BE PRESENT.   A presumptive positive result was obtained on the submitted specimen  and confirmed on repeat testing.  While 2019 novel coronavirus  (SARS-CoV-2) nucleic acids may be present in the submitted sample  additional confirmatory testing may be necessary for epidemiological  and / or clinical management purposes  to differentiate between  SARS-CoV-2 and other Sarbecovirus currently known to infect humans.  If clinically indicated additional testing with an alternate test  methodology [phone]([id number]) is advised. The SARS-CoV-2 RNA is generally  detectable in upper and lower respiratory sp ecimens during the acute  phase of infection. The expected result is Negative. Fact Sheet for Patients:  BoilerBrush.com.cyhttps://www.fda.gov/media/136312/download Fact Sheet for Healthcare Providers: https://pope.com/https://www.fda.gov/media/136313/download This test is not yet approved or cleared by the [place] FDA and has been authorized for detection and/or diagnosis of SARS-CoV-2 by FDA under an Emergency Use Authorization (EUA).  This EUA will remain in effect (meaning this test can be used) for the duration of the COVID-19 declaration under Section 564(b)(1) of the Act, 21 U.S.C. section 360bbb-3(b)(1), unless the authorization is terminated  or revoked sooner. Performed at [facility], [address] [place], [place] [address]      Time coordinating discharge: Over 30 minutes  SIGNED:   Burke Keelshristopher Spongberg, MD  [facility] 11/01/2018, 9:46 AM Pager   If 7PM-7AM, please contact night-coverage [email or web address] Password [email or web address]

## 2018-11-01 NOTE — Progress Notes (Signed)
Joann Colon to be D/C'd Home per MD order.  Discussed prescriptions and follow up appointments with the patient. Prescriptions given to patient, medication list explained in detail. Pt verbalized understanding.  Allergies as of 11/01/2018      Reactions   Xanax [alprazolam]    Becomes violent   Aspartame Nausea Only   Capsaicin Rash   Rash and Burning and Peeling.      Medication List    TAKE these medications   albuterol 108 (90 Base) MCG/ACT inhaler Commonly known as: VENTOLIN HFA Inhale 2 puffs into the lungs every 6 (six) hours as needed for wheezing or shortness of breath.   albuterol (5 MG/ML) 0.5% nebulizer solution Commonly known as: PROVENTIL Take 1 mL (5 mg total) by nebulization every 4 (four) hours as needed for wheezing.   ALBUTEROL IN Inhale into the lungs as needed.   atenolol 25 MG tablet Commonly known as: TENORMIN Take 25 mg by mouth daily.   atorvastatin 40 MG tablet Commonly known as: LIPITOR Take 40 mg by mouth daily.   baclofen 20 MG tablet Commonly known as: LIORESAL Take 20 mg by mouth every 8 (eight) hours as needed (headache).   Breo Ellipta 100-25 MCG/INH Aepb Generic drug: fluticasone furoate-vilanterol Inhale 1 puff into the lungs daily.   dicyclomine 20 MG tablet Commonly known as: BENTYL Take 1 tablet (20 mg total) by mouth 2 (two) times daily.   gabapentin 300 MG capsule Commonly known as: NEURONTIN Take 300 mg by mouth 3 (three) times daily.   lamoTRIgine 25 MG tablet Commonly known as: LAMICTAL Take 25-50 mg by mouth as directed. Take 1 tab by mouth daily for 1 week then increase to 1 tablet BID.   ondansetron 4 MG tablet Commonly known as: Zofran Take 1 tablet (4 mg total) by mouth daily as needed for nausea or vomiting.   predniSONE 20 MG tablet Commonly known as: DELTASONE Take 3 tablets (60 mg total) by mouth daily. What changed:   how much to take  when to take this  reasons to take this   promethazine 25 MG  tablet Commonly known as: PHENERGAN Take 1 tablet (25 mg total) by mouth every 6 (six) hours as needed for nausea or vomiting.   SINGULAIR PO Take 10 mg by mouth daily.   zonisamide 25 MG capsule Commonly known as: ZONEGRAN Take 50 mg by mouth daily.       Vitals:   10/31/18 2004 11/01/18 0520  BP: (!) 147/88 (!) 194/96  Pulse: (!) 55 (!) 45  Resp: 20 (!) 23  Temp: 98.2 F (36.8 C) 97.7 F (36.5 C)  SpO2: 96% 100%    Skin clean, dry and intact without evidence of skin break down, no evidence of skin tears noted. IV catheter discontinued intact. Site without signs and symptoms of complications. Dressing and pressure applied. Pt denies pain at this time. No complaints noted.  An After Visit Summary was printed and given to the patient. Patient escorted via [place], and D/C home via private auto.  Joann Colon 11/01/2018 10:41 AM

## 2020-12-05 IMAGING — CT CT ABDOMEN AND PELVIS WITH CONTRAST
2 of 5 series · 16 of 46 positions shown, 18 images · IV contrast (omnipaque)
Comparison: 09/14/2018 CT abdomen/pelvis.

CLINICAL DATA: Abdominal pain, nausea and vomiting for several days

EXAM:
CT ABDOMEN AND PELVIS WITH CONTRAST
TECHNIQUE: Multidetector CT imaging of the abdomen and pelvis was performed
using the standard protocol following bolus administration of
intravenous contrast.
CONTRAST:  100mL OMNIPAQUE IOHEXOL 300 MG/ML  SOLN

[Series 3: a/p w/ 5mm · axial · 0.65mm/px · z∈[+702,+1062]mm · 13 of 82 slices shown, 15 images]
[im 5/82  soft-tissue]
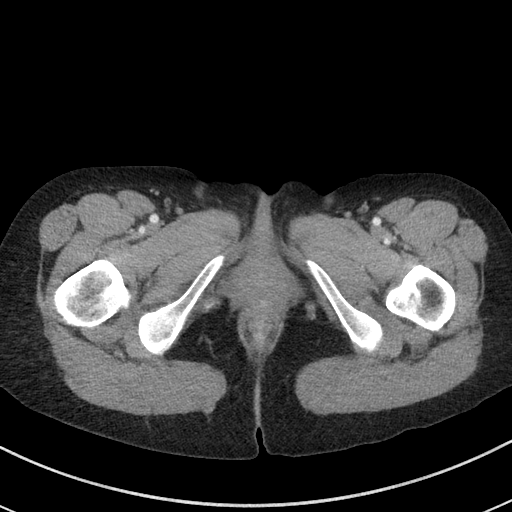
[im 5/82  bone]
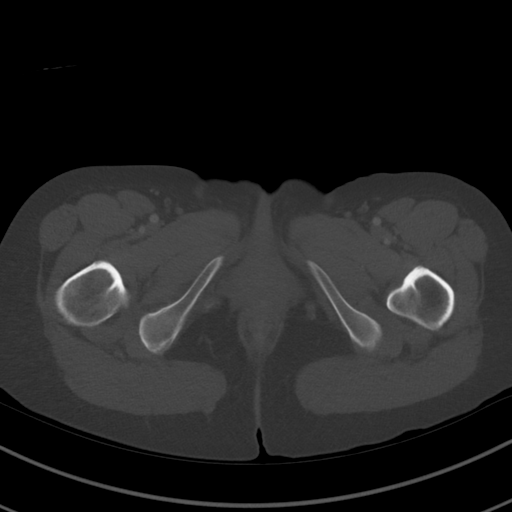
[im 13/82  soft-tissue]
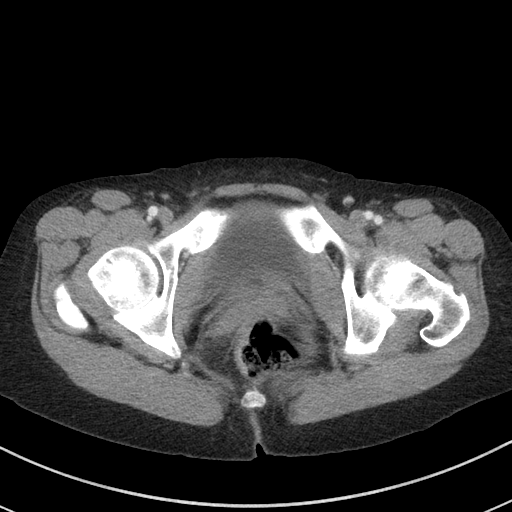
[im 18/82  soft-tissue]
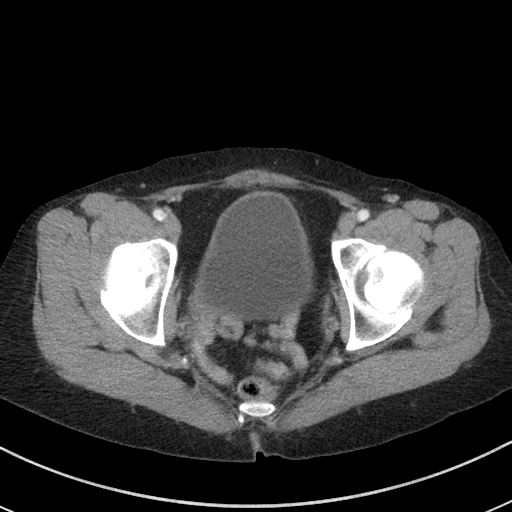
[im 22/82  soft-tissue]
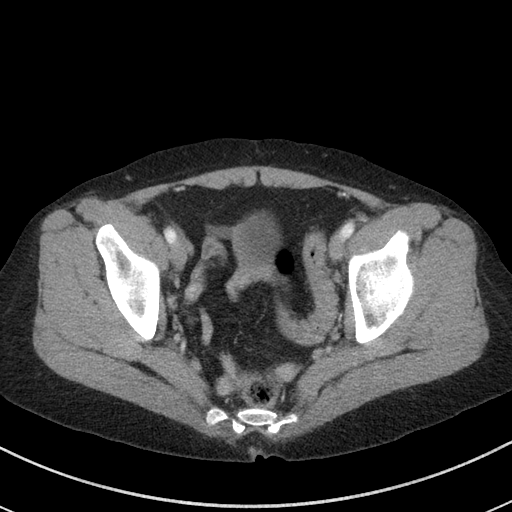
[im 30/82  soft-tissue]
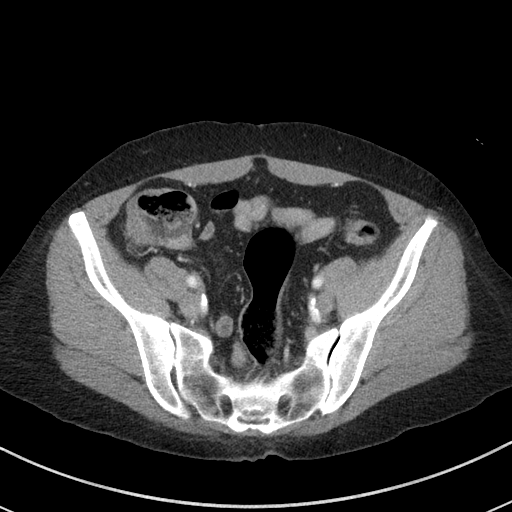
[im 35/82  soft-tissue]
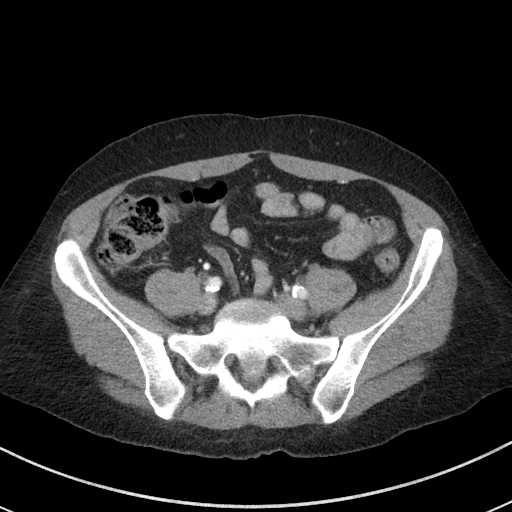
[im 43/82  soft-tissue]
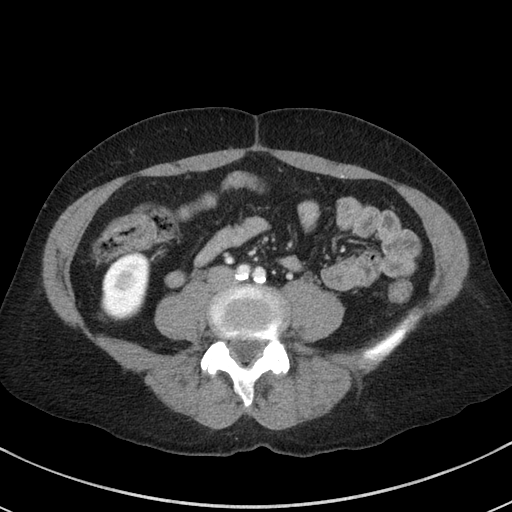
[im 47/82  soft-tissue]
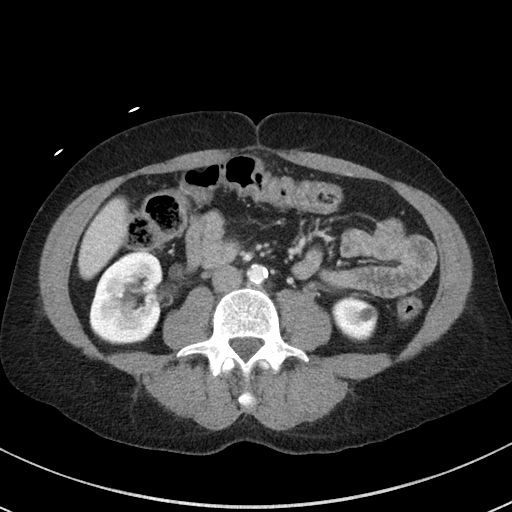
[im 52/82  soft-tissue]
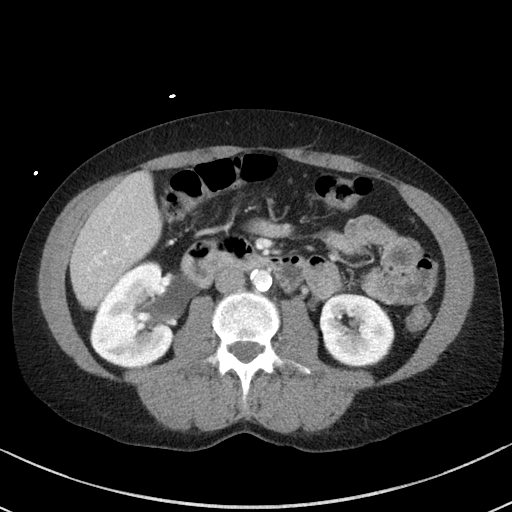
[im 52/82  bone]
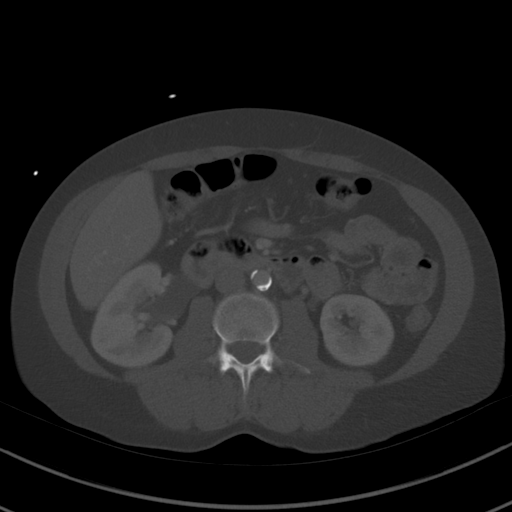
[im 60/82  soft-tissue]
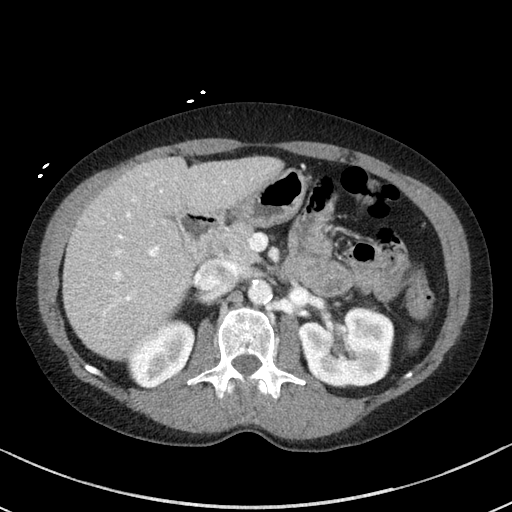
[im 64/82  soft-tissue]
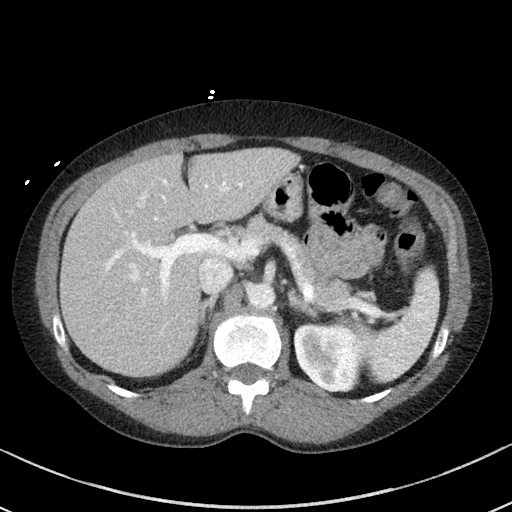
[im 69/82  soft-tissue]
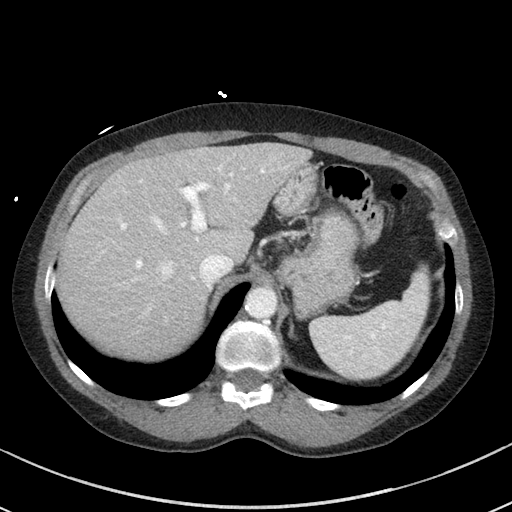
[im 77/82  soft-tissue]
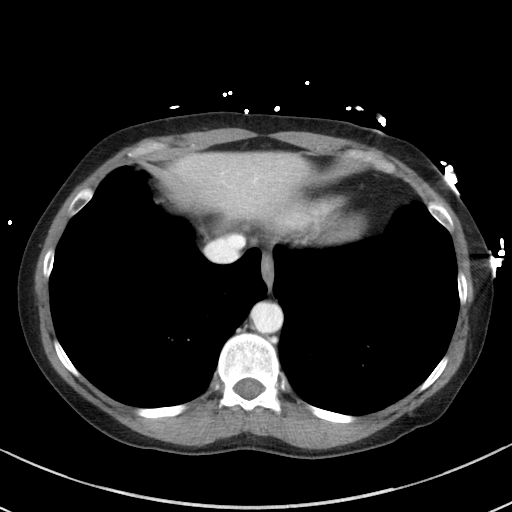

[Series 6: a/p w/ cor · coronal · 0.61mm/px · 3 of 137 slices shown]
[im 46/137  soft-tissue]
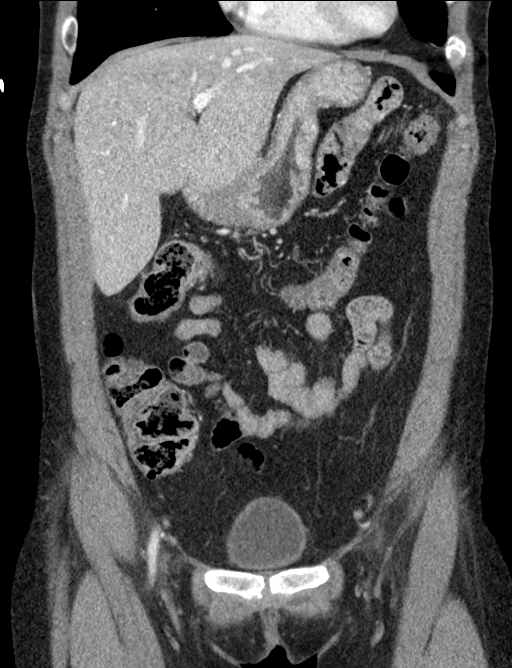
[im 61/137  soft-tissue]
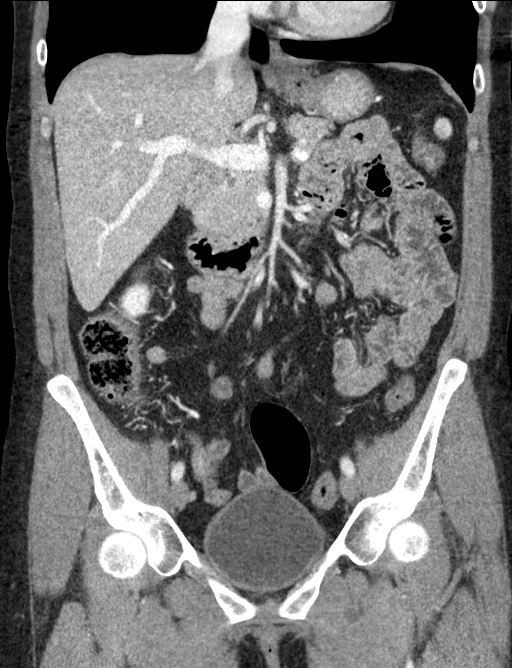
[im 76/137  soft-tissue]
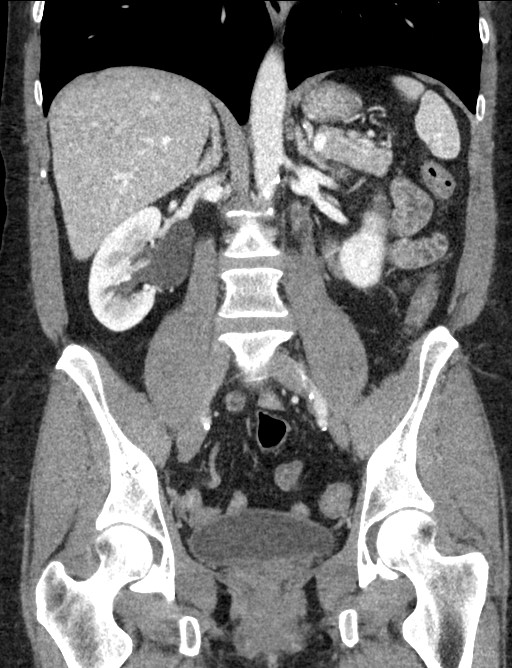

[16 of 46 positions shown; findings below may reference images not displayed]

FINDINGS: Lower chest: No significant pulmonary nodules or acute consolidative
airspace disease.

Hepatobiliary: Normal liver size. No liver mass. Cholecystectomy. No
biliary ductal dilatation.

Pancreas: Normal, with no mass or duct dilation.

Spleen: Normal size. No mass.

Adrenals/Urinary Tract: Normal adrenals. A few scattered
subcentimeter hypodense renal cortical lesions in both kidneys are
too small to characterize and are unchanged, requiring no follow-up.
Fullness of the central renal collecting systems bilaterally,
without overt hydronephrosis. Contrast nephrograms are symmetric and
normal. Normal bladder.

Stomach/Bowel: Normal non-distended stomach. Normal caliber small
bowel with no small bowel wall thickening. Normal appendix. Normal
large bowel with no diverticulosis, large bowel wall thickening or
pericolonic fat stranding.

Vascular/Lymphatic: Atherosclerotic nonaneurysmal abdominal aorta.
Patent portal, splenic, hepatic and renal veins. No pathologically
enlarged lymph nodes in the abdomen or pelvis.

Reproductive: Status post hysterectomy, with no abnormal findings at
the vaginal cuff. No adnexal mass.

Other: No pneumoperitoneum, ascites or focal fluid collection.

Musculoskeletal: No aggressive appearing focal osseous lesions. Mild
thoracolumbar spondylosis.
IMPRESSION: 1. No acute abnormality. No evidence of bowel obstruction or acute
bowel inflammation. Normal appendix.
2.  Aortic Atherosclerosis ([id number]).
